# Patient Record
Sex: Female | Born: 1980 | Race: White | Hispanic: No | Smoking: Never smoker
Health system: Southern US, Community
[De-identification: ages and names within clinical notes are randomized; demographics above are authoritative.]

## PROBLEM LIST (undated history)

## (undated) DIAGNOSIS — K589 Irritable bowel syndrome without diarrhea: Secondary | ICD-10-CM

## (undated) DIAGNOSIS — R51 Headache: Secondary | ICD-10-CM

## (undated) DIAGNOSIS — F419 Anxiety disorder, unspecified: Secondary | ICD-10-CM

## (undated) DIAGNOSIS — E538 Deficiency of other specified B group vitamins: Secondary | ICD-10-CM

## (undated) DIAGNOSIS — K5900 Constipation, unspecified: Secondary | ICD-10-CM

## (undated) DIAGNOSIS — K449 Diaphragmatic hernia without obstruction or gangrene: Secondary | ICD-10-CM

## (undated) DIAGNOSIS — K3184 Gastroparesis: Secondary | ICD-10-CM

## (undated) DIAGNOSIS — N3010 Interstitial cystitis (chronic) without hematuria: Secondary | ICD-10-CM

## (undated) DIAGNOSIS — K219 Gastro-esophageal reflux disease without esophagitis: Secondary | ICD-10-CM

## (undated) HISTORY — DX: Interstitial cystitis (chronic) without hematuria: N30.10

## (undated) HISTORY — PX: UPPER GASTROINTESTINAL ENDOSCOPY: SHX188

## (undated) HISTORY — DX: Headache: R51

## (undated) HISTORY — DX: Irritable bowel syndrome, unspecified: K58.9

## (undated) HISTORY — DX: Diaphragmatic hernia without obstruction or gangrene: K44.9

## (undated) HISTORY — PX: HERNIA REPAIR: SHX51

## (undated) HISTORY — DX: Deficiency of other specified B group vitamins: E53.8

## (undated) HISTORY — PX: BOTOX INJECTION: SHX5754

## (undated) HISTORY — DX: Anxiety disorder, unspecified: F41.9

## (undated) HISTORY — DX: Gastro-esophageal reflux disease without esophagitis: K21.9

## (undated) HISTORY — DX: Constipation, unspecified: K59.00

---

## 2000-04-12 ENCOUNTER — Other Ambulatory Visit: Admission: RE | Admit: 2000-04-12 | Discharge: 2000-04-12 | Payer: Self-pay | Admitting: Obstetrics and Gynecology

## 2002-03-03 ENCOUNTER — Emergency Department (HOSPITAL_COMMUNITY): Admission: EM | Admit: 2002-03-03 | Discharge: 2002-03-03 | Payer: Self-pay | Admitting: Emergency Medicine

## 2002-08-22 ENCOUNTER — Other Ambulatory Visit: Admission: RE | Admit: 2002-08-22 | Discharge: 2002-08-22 | Payer: Self-pay | Admitting: Obstetrics and Gynecology

## 2003-08-22 ENCOUNTER — Other Ambulatory Visit: Admission: RE | Admit: 2003-08-22 | Discharge: 2003-08-22 | Payer: Self-pay | Admitting: Obstetrics and Gynecology

## 2003-12-06 ENCOUNTER — Emergency Department (HOSPITAL_COMMUNITY): Admission: EM | Admit: 2003-12-06 | Discharge: 2003-12-06 | Payer: Self-pay | Admitting: Unknown Physician Specialty

## 2004-09-12 ENCOUNTER — Other Ambulatory Visit: Admission: RE | Admit: 2004-09-12 | Discharge: 2004-09-12 | Payer: Self-pay | Admitting: Obstetrics and Gynecology

## 2005-09-14 ENCOUNTER — Other Ambulatory Visit: Admission: RE | Admit: 2005-09-14 | Discharge: 2005-09-14 | Payer: Self-pay | Admitting: Obstetrics and Gynecology

## 2006-05-25 HISTORY — PX: DIAPHRAGMATIC HERNIA REPAIR: SUR1167

## 2006-05-25 HISTORY — PX: CHOLECYSTECTOMY: SHX55

## 2006-06-04 ENCOUNTER — Inpatient Hospital Stay (HOSPITAL_COMMUNITY): Admission: AD | Admit: 2006-06-04 | Discharge: 2006-06-05 | Payer: Self-pay | Admitting: Gynecology

## 2006-06-18 ENCOUNTER — Ambulatory Visit: Payer: Self-pay | Admitting: Gastroenterology

## 2006-06-23 ENCOUNTER — Ambulatory Visit (HOSPITAL_COMMUNITY): Admission: RE | Admit: 2006-06-23 | Discharge: 2006-06-23 | Payer: Self-pay | Admitting: Gastroenterology

## 2006-09-13 ENCOUNTER — Inpatient Hospital Stay (HOSPITAL_COMMUNITY): Admission: EM | Admit: 2006-09-13 | Discharge: 2006-09-15 | Payer: Self-pay | Admitting: Emergency Medicine

## 2006-09-14 ENCOUNTER — Encounter: Payer: Self-pay | Admitting: Gastroenterology

## 2006-09-17 ENCOUNTER — Ambulatory Visit: Payer: Self-pay | Admitting: Gastroenterology

## 2006-09-24 ENCOUNTER — Encounter: Admission: RE | Admit: 2006-09-24 | Discharge: 2006-09-24 | Payer: Self-pay | Admitting: Obstetrics and Gynecology

## 2006-09-24 ENCOUNTER — Observation Stay (HOSPITAL_COMMUNITY): Admission: EM | Admit: 2006-09-24 | Discharge: 2006-09-25 | Payer: Self-pay | Admitting: Emergency Medicine

## 2007-06-08 IMAGING — NM NM HEPATO W/GB/PHARM/[PERSON_NAME]
2 series · 12 of 12 positions shown · non-contrast
Comparison: Ultrasound 06/05/06.

CLINICAL DATA: 25 year-old with chronic gallbladder disease.
NUCLEAR MEDICINE HEPATOBILIARY SCAN WITH EJECTION FRACTION:
TECHNIQUE: Sequential abdominal images were obtained following intravenous injection of radiopharmaceutical.  Sequential images were continued following oral ingestion of 8 oz. half-and-half, and the gallbladder ejection fraction was calculated.
Radiopharmaceutical:   mCi Nc-99m Choletec

[Series 1: he hepatobiliary · 3.21mm/px · 6 of 60 frames shown (1 of 2)]
[frame 6/60]
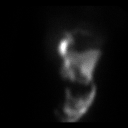
[frame 16/60]
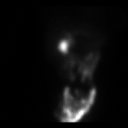
[frame 26/60]
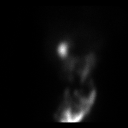
[frame 36/60]
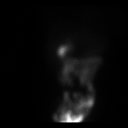
[frame 46/60]
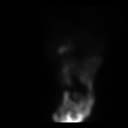
[frame 56/60]
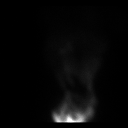

[Series 1: he hepatobiliary · 3.21mm/px · 6 of 60 frames shown (2 of 2)]
[frame 6/60]
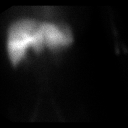
[frame 16/60]
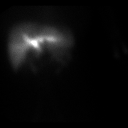
[frame 26/60]
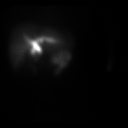
[frame 36/60]
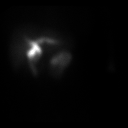
[frame 46/60]
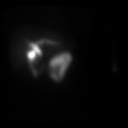
[frame 56/60]
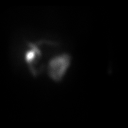

[12 of 12 positions shown; findings below may reference images not displayed]

FINDINGS: There is prompt symmetric uptake in the liver and prompt excretion in the biliary tree, which is visualized at approximately 9 minutes. Activity is seen in the small bowel by 18 minutes. Activity is seen in the gallbladder beginning at approximately 24 minutes.
The patient?s gallbladder ejection fraction was estimated at 93%.
IMPRESSION: Normal biliary patency study and normal gallbladder ejection fraction.

## 2009-11-12 ENCOUNTER — Telehealth: Payer: Self-pay | Admitting: Gastroenterology

## 2009-11-13 ENCOUNTER — Telehealth: Payer: Self-pay | Admitting: Gastroenterology

## 2009-11-22 ENCOUNTER — Ambulatory Visit: Payer: Self-pay | Admitting: Gastroenterology

## 2009-11-22 DIAGNOSIS — K219 Gastro-esophageal reflux disease without esophagitis: Secondary | ICD-10-CM | POA: Insufficient documentation

## 2009-11-22 DIAGNOSIS — R112 Nausea with vomiting, unspecified: Secondary | ICD-10-CM

## 2009-11-27 ENCOUNTER — Ambulatory Visit: Payer: Self-pay | Admitting: Gastroenterology

## 2009-11-27 DIAGNOSIS — E538 Deficiency of other specified B group vitamins: Secondary | ICD-10-CM | POA: Insufficient documentation

## 2009-11-27 LAB — CONVERTED CEMR LAB
Albumin: 4.2 g/dL (ref 3.5–5.2)
BUN: 18 mg/dL (ref 6–23)
Basophils Absolute: 0 10*3/uL (ref 0.0–0.1)
Creatinine, Ser: 0.9 mg/dL (ref 0.4–1.2)
Eosinophils Absolute: 0.2 10*3/uL (ref 0.0–0.7)
Eosinophils Relative: 2.1 % (ref 0.0–5.0)
Ferritin: 18.5 ng/mL (ref 10.0–291.0)
Folate: 8.4 ng/mL
GFR calc non Af Amer: 75.79 mL/min (ref >60)
HCT: 37.4 % (ref 36.0–46.0)
Iron: 76 ug/dL (ref 42–145)
Lymphocytes Relative: 30.6 % (ref 12.0–46.0)
Monocytes Absolute: 0.4 10*3/uL (ref 0.1–1.0)
Monocytes Relative: 5.3 % (ref 3.0–12.0)
Neutro Abs: 4.9 10*3/uL (ref 1.4–7.7)
Platelets: 150 10*3/uL (ref 150.0–400.0)
TSH: 2.34 microintl units/mL (ref 0.35–5.50)
Total Bilirubin: 0.4 mg/dL (ref 0.3–1.2)
Vitamin B-12: 151 pg/mL — ABNORMAL LOW (ref 211–911)
WBC: 7.9 10*3/uL (ref 4.5–10.5)

## 2009-12-02 ENCOUNTER — Telehealth: Payer: Self-pay | Admitting: Gastroenterology

## 2009-12-04 ENCOUNTER — Ambulatory Visit: Payer: Self-pay | Admitting: Gastroenterology

## 2009-12-05 ENCOUNTER — Encounter: Payer: Self-pay | Admitting: Gastroenterology

## 2009-12-09 ENCOUNTER — Encounter: Payer: Self-pay | Admitting: Gastroenterology

## 2009-12-10 ENCOUNTER — Ambulatory Visit: Payer: Self-pay | Admitting: Gastroenterology

## 2009-12-10 DIAGNOSIS — R51 Headache: Secondary | ICD-10-CM

## 2009-12-10 DIAGNOSIS — R519 Headache, unspecified: Secondary | ICD-10-CM | POA: Insufficient documentation

## 2009-12-10 DIAGNOSIS — K5900 Constipation, unspecified: Secondary | ICD-10-CM | POA: Insufficient documentation

## 2009-12-26 ENCOUNTER — Telehealth: Payer: Self-pay | Admitting: Gastroenterology

## 2010-02-04 ENCOUNTER — Telehealth: Payer: Self-pay | Admitting: Gastroenterology

## 2010-02-07 ENCOUNTER — Ambulatory Visit: Payer: Self-pay | Admitting: Gastroenterology

## 2010-03-07 ENCOUNTER — Ambulatory Visit: Payer: Self-pay | Admitting: Gastroenterology

## 2010-03-17 ENCOUNTER — Encounter: Payer: Self-pay | Admitting: Gastroenterology

## 2010-03-21 ENCOUNTER — Encounter: Payer: Self-pay | Admitting: Gastroenterology

## 2010-03-25 ENCOUNTER — Ambulatory Visit (HOSPITAL_COMMUNITY)
Admission: RE | Admit: 2010-03-25 | Discharge: 2010-03-25 | Payer: Self-pay | Source: Home / Self Care | Admitting: Obstetrics and Gynecology

## 2010-03-25 ENCOUNTER — Ambulatory Visit: Payer: Self-pay | Admitting: Gastroenterology

## 2010-03-25 DIAGNOSIS — K589 Irritable bowel syndrome without diarrhea: Secondary | ICD-10-CM | POA: Insufficient documentation

## 2010-03-25 LAB — CONVERTED CEMR LAB
Basophils Absolute: 0 10*3/uL (ref 0.0–0.1)
Bilirubin, Direct: 0.1 mg/dL (ref 0.0–0.3)
Calcium: 8.6 mg/dL (ref 8.4–10.5)
Chloride: 107 meq/L (ref 96–112)
Creatinine, Ser: 0.8 mg/dL (ref 0.4–1.2)
Eosinophils Absolute: 0.1 10*3/uL (ref 0.0–0.7)
Ferritin: 14.9 ng/mL (ref 10.0–291.0)
Hemoglobin, Urine: NEGATIVE
Hemoglobin: 12.4 g/dL (ref 12.0–15.0)
Iron: 145 ug/dL (ref 42–145)
Lymphocytes Relative: 30.1 % (ref 12.0–46.0)
Lymphs Abs: 2.1 10*3/uL (ref 0.7–4.0)
MCHC: 34.2 g/dL (ref 30.0–36.0)
Neutro Abs: 4.4 10*3/uL (ref 1.4–7.7)
Nitrite: NEGATIVE
RDW: 13.3 % (ref 11.5–14.6)
Specific Gravity, Urine: 1.02 (ref 1.000–1.030)
Total Bilirubin: 0.6 mg/dL (ref 0.3–1.2)
Total Protein, Urine: NEGATIVE mg/dL
Transferrin: 329.4 mg/dL (ref 212.0–360.0)
Vitamin B-12: >1500 pg/mL — ABNORMAL HIGH (ref 211–911)
pH: 7 (ref 5.0–8.0)

## 2010-03-26 ENCOUNTER — Encounter: Payer: Self-pay | Admitting: Gastroenterology

## 2010-04-01 ENCOUNTER — Telehealth: Payer: Self-pay | Admitting: Gastroenterology

## 2010-04-25 ENCOUNTER — Ambulatory Visit: Payer: Self-pay | Admitting: Gastroenterology

## 2010-05-30 ENCOUNTER — Ambulatory Visit
Admission: RE | Admit: 2010-05-30 | Discharge: 2010-05-30 | Payer: Self-pay | Source: Home / Self Care | Attending: Gastroenterology | Admitting: Gastroenterology

## 2010-06-14 ENCOUNTER — Encounter: Payer: Self-pay | Admitting: Gastroenterology

## 2010-06-15 ENCOUNTER — Encounter: Payer: Self-pay | Admitting: Neurology

## 2010-06-24 NOTE — Progress Notes (Signed)
Summary: Meds not covered by her insurance  Phone Note Call from Patient Call back at Home Phone [phone]   Call For: Dr Jarold Motto Reason for Call: Talk to Nurse Summary of Call: Nexium or Zegrid is not approved by her insurance. Would like for you to call her insurance for alternative meds. [employer] [id number] Initial call taken by: Leanor Kail [facility],  December 26, 2009 11:28 AM  Follow-up for Phone Call        Talked with pt.  Pt looked on internet site and found out that omeprazole is covered.  Will change rx and pt will report back if does not work for her.  Follow-up by: Ashok Cordia RN,  December 26, 2009 11:46 AM    New/Updated Medications: OMEPRAZOLE 40 MG  CPDR (OMEPRAZOLE) 1 each day 30 minutes before meal Prescriptions: OMEPRAZOLE 40 MG  CPDR (OMEPRAZOLE) 1 each day 30 minutes before meal  #30 x 11   Entered by:   Ashok Cordia RN   Authorized by:   Mardella Layman MD [facility]   Signed by:   Ashok Cordia RN on 12/26/2009   Method used:   Electronically to        [facility]. [phone]* (retail)       [address]       [place], [place]  [address]       Ph: [phone]       Fax: [phone]   RxID:   [id number]

## 2010-06-24 NOTE — Assessment & Plan Note (Signed)
Summary: #1 of 3 weekly B12 inj/dfs  Nurse Visit   Allergies: 1)  ! * Aciphex  Medication Administration  Injection # 1:    Medication: Vit B12 1000 mcg    Diagnosis: VITAMIN B12 DEFICIENCY (ICD-266.2)    Route: IM    Site: L deltoid    Exp Date: 02/13    Lot #: [id number]    Mfr: American Regent    Patient tolerated injection without complications    Given by: Ashok Cordia RN (November 27, 2009 12:48 PM)  Orders Added: 1)  Vit B12 1000 mcg [J3420]

## 2010-06-24 NOTE — Progress Notes (Signed)
Summary: B12 injection  Phone Note Call from Patient Call back at Home Phone [phone]   Caller: Patient Call For: Dr. Jarold Motto Reason for Call: Talk to Nurse Summary of Call: having difficulty with  B12 injections rx's Initial call taken by: Vallarie Mare,  February 04, 2010 1:26 PM  Follow-up for Phone Call        patient will start monthly b12 injections since nascobal is too expensive Follow-up by: Harlow Mares CMA Duncan Dull),  February 04, 2010 2:28 PM

## 2010-06-24 NOTE — Assessment & Plan Note (Signed)
Summary: MONTHLY B12...AS.  Nurse Visit   Allergies: 1)  ! * Aciphex 2)  ! * Celphosporins  Medication Administration  Injection # 1:    Medication: Vit B12 1000 mcg    Diagnosis: VITAMIN B12 DEFICIENCY (ICD-266.2)    Route: IM    Site: L deltoid    Exp Date: 10/13    Lot #: [id number]    Mfr: American Regent    Patient tolerated injection without complications    Given by: Lamona Curl CMA (AAMA) (April 25, 2010 1:17 PM)  Orders Added: 1)  Vit B12 1000 mcg [J3420]   Medication Administration  Injection # 1:    Medication: Vit B12 1000 mcg    Diagnosis: VITAMIN B12 DEFICIENCY (ICD-266.2)    Route: IM    Site: L deltoid    Exp Date: 10/13    Lot #: [id number]    Mfr: American Regent    Patient tolerated injection without complications    Given by: Lamona Curl CMA (AAMA) (April 25, 2010 1:17 PM)  Orders Added: 1)  Vit B12 1000 mcg [J3420]

## 2010-06-24 NOTE — Assessment & Plan Note (Signed)
Summary: f/u reflux/ and 3 of 3 b12all   History of Present Illness Visit Type: Follow-up Visit Primary GI MD: Sheryn Bison MD FACP FAGA Primary Provider: Herb Grays, MD  Requesting Provider: na Chief Complaint: Chronic headaches and nausea. Pt wants to discuss switching PPI's and get 3rd B12 injection.  History of Present Illness:   Continued headaches despite neurology consultation. She has not used any of her GI medications as recommended and continues with reflux symptoms.   GI Review of Systems      Denies abdominal pain, acid reflux, belching, bloating, chest pain, dysphagia with liquids, dysphagia with solids, heartburn, loss of appetite, nausea, vomiting, vomiting blood, weight loss, and  weight gain.        Denies anal fissure, black tarry stools, change in bowel habit, constipation, diarrhea, diverticulosis, fecal incontinence, heme positive stool, hemorrhoids, irritable bowel syndrome, jaundice, light color stool, liver problems, rectal bleeding, and  rectal pain.    Current Medications (verified): 1)  Reglan 10 Mg Tabs (Metoclopramide Hcl) .... One Tablet By Mouth Every 6-8 Hours As Needed For Nausea and Vomiting 2)  Cyanocobalamin 1000 Mcg/ml Inj Soln (Cyanocobalamin) .Marland Kitchen.. 1 Cc Im Weekly X 3. 3)  Nascobal 500 Mcg/0.51ml Soln (Cyanocobalamin) .... 2 Spray Once A Week 4)  Lamictal 150 Mg Tabs (Lamotrigine) .... One Tablet By Mouth Once Daily 5)  Trinessa (28) 0.18/0.215/0.25 Mg-35 Mcg Tabs (Norgestim-Eth Estrad Triphasic) .... One Tablet By Mouth Once Daily  Allergies: 1)  ! * Aciphex  Past History:  Past medical, surgical, family and social histories (including risk factors) reviewed for relevance to current acute and chronic problems.  Past Medical History: Reviewed history from 11/22/2009 and no changes required. Anxiety Disorder GERD Diaphragmatic hernia Chronic Headaches  Past Surgical History: Reviewed history from 11/18/2009 and no changes  required. Cholecystectomy  Family History: Reviewed history from 11/22/2009 and no changes required. No FH of Colon Cancer:  Social History: Reviewed history from 11/22/2009 and no changes required. Engaged--Married in one month  No Childern [employer]  Patient has never smoked.  Alcohol Use - no Daily Caffeine Use: one cup of coffee or one cup of soda  Illicit Drug Use - no  Review of Systems       The patient complains of headaches.  The patient denies anorexia, fever, weight loss, weight gain, vision loss, decreased hearing, hoarseness, chest pain, syncope, dyspnea on exertion, peripheral edema, prolonged cough, hemoptysis, abdominal pain, melena, hematochezia, severe indigestion/heartburn, hematuria, incontinence, genital sores, muscle weakness, suspicious skin lesions, transient blindness, difficulty walking, depression, unusual weight change, abnormal bleeding, enlarged lymph nodes, angioedema, breast masses, and testicular masses.    Vital Signs:  Patient profile:   30 year old female Height:      64 inches Weight:      161.13 pounds BMI:     27.76 Pulse rate:   76 / minute Pulse rhythm:   regular BP sitting:   104 / 60  (right arm) Cuff size:   regular  Vitals Entered By: Christie Nottingham CMA Duncan Dull) (December 10, 2009 4:06 PM)  Physical Exam  General:  Well developed, well nourished, no acute distress.healthy appearing.   Head:  Normocephalic and atraumatic. Eyes:  PERRLA, no icterus.exam deferred to patient's ophthalmologist.   Neurologic:  Alert and  oriented x4;  grossly normal neurologically. Psych:  Alert and cooperative. Normal mood and affect.   Impression & Recommendations:  Problem # 1:  GERD (ICD-530.81) Assessment Unchanged Previous reaction AcipHex, and the patient cannot  get Zegerid from her insurance company. We will try Nexium 40 mg q.a.m. with Reglan 10 mg at bedtime. Standard anti-reflex maneuvers also reviewed.  Problem # 2:  VITAMIN B12  DEFICIENCY (ICD-266.2) Assessment: Improved nasal B12 spray as recommended.  Problem # 3:  HEADACHE (ICD-784.0) Assessment: Unchanged Continued followup and therapy per neurology recommended stressed to this patient. She has chronic migraine headaches which are apparently difficult to manage and control. Some of her nausea and vomiting are obviously related to her neurologic difficulties. Remains unclear to me whether or not she had imaging such as CT scan or MRI exams.This again has been recommended.  Problem # 4:  CONSTIPATION (ICD-564.00) Assessment: Unchanged p.r.n. MiraLax at bedtime as tolerated.  Patient Instructions: 1)  Take nexium each morning and reglan at bedtime. 2)  Begin Miralax at bedtime. 3)  The medication list was reviewed and reconciled.  All changed / newly prescribed medications were explained.  A complete medication list was provided to the patient / caregiver. 4)  Avoid foods high in acid content ( tomatoes, citrus juices, spicy foods) . Avoid eating within 3 to 4 hours of lying down or before exercising. Do not over eat; try smaller more frequent meals. Elevate head of bed four inches when sleeping.  5)  Please call our GI Office back in 2 weeks with a follow-up of symptoms. Prescriptions: NASCOBAL 500 MCG/0.1ML SOLN (CYANOCOBALAMIN) 1 spray once a week  #1 x 6   Entered by:   Ashok Cordia RN   Authorized by:   Mardella Layman MD [facility]   Signed by:   Ashok Cordia RN on 12/10/2009   Method used:   Print then Give to Patient   RxID:   [id number]   Appended Document: f/u reflux/ and 3 of 3 b12all    Clinical Lists Changes  Orders: Added new Service order of Vit B12 1000 mcg (F6433) - Signed       Medication Administration  Injection # 1:    Medication: Vit B12 1000 mcg    Diagnosis: VITAMIN B12 DEFICIENCY (ICD-266.2)    Route: IM    Site: L deltoid    Exp Date: 02/13    Lot #: [id number]    Mfr: American Regent    Patient tolerated injection  without complications    Given by: Ashok Cordia RN (December 10, 2009 4:43 PM)  Orders Added: 1)  Vit B12 1000 mcg [J3420]

## 2010-06-24 NOTE — Progress Notes (Signed)
Summary: Triage  Phone Note Call from Patient Call back at Home Phone [phone]   Caller: Patient Call For: Dr. Jarold Motto Reason for Call: Talk to Nurse Summary of Call: pt. had an hernia and had it removed in 2008. She is having acid reflux, vomiting and burning in esophagus Initial call taken by: Karna Christmas,  November 12, 2009 9:36 AM  Follow-up for Phone Call        bid aciphexPt had GB removed in 2008 and then had surgery for a diaphragmatic hernia.  Pt now complains of reflux and abd pain.  Appt scheduled for November 22, 2009.  Pt asking if we can suggest anything for her to try until OV. Follow-up by: Ashok Cordia RN,  November 12, 2009 10:20 AM  Additional Follow-up for Phone Call Additional follow up Details #1::        two times a day aciphex Additional Follow-up by: Mardella Layman MD Clementeen Graham,  November 12, 2009 12:23 PM     Appended Document: Triage Lm fot pt to call.   Appended Document: Triage Pt notified.   Clinical Lists Changes  Medications: Added new medication of ACIPHEX 20 MG  TBEC (RABEPRAZOLE SODIUM) Take 1 twice a day 30 minutes before meals - Signed Rx of ACIPHEX 20 MG  TBEC (RABEPRAZOLE SODIUM) Take 1 twice a day 30 minutes before meals;  #60 x 6;  Signed;  Entered by: Ashok Cordia RN;  Authorized by: Mardella Layman MD [facility];  Method used: Electronically to [facility]. [phone]*, [address], [place], [place]  [address], Ph: [phone], Fax: [phone]    Prescriptions: ACIPHEX 20 MG  TBEC (RABEPRAZOLE SODIUM) Take 1 twice a day 30 minutes before meals  #60 x 6   Entered by:   Ashok Cordia RN   Authorized by:   Mardella Layman MD [facility]   Signed by:   Ashok Cordia RN on 11/12/2009   Method used:   Electronically to        [facility]. [phone]* (retail)       [address] [place], [place]  [address]       Ph: [phone]       Fax: [phone]   RxID:   [id number]

## 2010-06-24 NOTE — Assessment & Plan Note (Signed)
Summary: MONTHLY B12 SHOT...LSW.  Nurse Visit   Allergies: 1)  ! * Aciphex  Medication Administration  Injection # 1:    Medication: Vit B12 1000 mcg    Diagnosis: VITAMIN B12 DEFICIENCY (ICD-266.2)    Route: IM    Site: L deltoid    Exp Date: 11/23/2011    Lot #: [id number]    Mfr: American Regent    Patient tolerated injection without complications    Given by: Christie Nottingham CMA Duncan Dull) (March 07, 2010 10:27 AM)  Orders Added: 1)  Vit B12 1000 mcg [J3420]

## 2010-06-24 NOTE — Assessment & Plan Note (Signed)
Summary: FOLLOW UP/YF   History of Present Illness Visit Type: Initial Visit Primary GI MD: Sheryn Bison MD FACP FAGA Primary Chang Tiggs: Herb Grays, MD  Requesting Gurtha Picker: na Chief Complaint: Abdominal pain, was treated for UTI at Urgent care but is still having pain, Had xray that showed her colon full of stool. Pt is suppose to have a pelvic exam today History of Present Illness:   30 year old Caucasian female that we have been following for acid reflux taking omeprazole 40 mg q.a.m. and Reglan 10 mg at bedtime. She recently has had dysuria and urinary frequency with 2 apparently negative urinalysis exams. She did take a 5 day course of Macrobid. She continues with urinary frequency and dysuria without specific GI complaints except for gas, bloating, and occasional right CVA pain.  As part of her recent workup for her dysuria she had a KUB which apparently showed excessive amounts of stool in her colon. Because of this she has been on p.r.n. MiraLax and Colace. She never had any clinical constipation problems or symptoms before she was told that she was constipated. She denies rectal bleeding, lower abdominal pain, or other neurological problems. 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 Yehuda Budd. Family history is noncontributory.   GI Review of Systems    Reports abdominal pain, bloating, and  vomiting.     Location of  Abdominal pain: lower abdomen.    Denies acid reflux, belching, chest pain, dysphagia with liquids, dysphagia with solids, heartburn, loss of appetite, nausea, vomiting blood, weight loss, and  weight gain.      Reports constipation.     Denies anal fissure, black tarry stools, change in bowel habit, diarrhea, diverticulosis, fecal incontinence, heme positive stool, hemorrhoids, irritable bowel syndrome, jaundice, light color stool, liver problems, rectal bleeding, and  rectal pain.    Current  Medications (verified): 1)  Reglan 10 Mg Tabs (Metoclopramide Hcl) .Marland Kitchen.. 1 By Mouth Q Hs 2)  Lamictal 150 Mg Tabs (Lamotrigine) .... One Tablet By Mouth Once Daily 3)  Trinessa (28) 0.18/0.215/0.25 Mg-35 Mcg Tabs (Norgestim-Eth Estrad Triphasic) .... One Tablet By Mouth Once Daily 4)  Omeprazole 40 Mg  Cpdr (Omeprazole) .Marland Kitchen.. 1 Each Day 30 Minutes Before Meal 5)  Miralax  Powd (Polyethylene Glycol 3350) .... Use As Directed At Bedtime 6)  Adderall Xr 30 Mg Xr24h-Cap (Amphetamine-Dextroamphetamine) .Marland Kitchen.. 1 By Mouth Once Daily 7)  Colace 100 Mg Caps (Docusate Sodium) .Marland Kitchen.. 1 By Mouth Two Times A Day 8)  Zoloft 100 Mg Tabs (Sertraline Hcl) .Marland Kitchen.. 1 By Mouth Once Daily  Allergies: 1)  ! * Aciphex 2)  ! * Celphosporins  Past History:  Family History: Last updated: 11/22/2009 No FH of Colon Cancer:  Past medical, surgical, family and social histories (including risk factors) reviewed for relevance to current acute and chronic problems.  Past Medical History: Reviewed history from 11/22/2009 and no changes required. Anxiety Disorder GERD Diaphragmatic hernia Chronic Headaches  Past Surgical History: Reviewed history from 11/18/2009 and no changes required. Cholecystectomy  Family History: Reviewed history from 11/22/2009 and no changes required. No FH of Colon Cancer:  Social History: Reviewed history from 11/22/2009 and no changes required. 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  Alcohol Use - no Daily Caffeine Use: one cup of coffee or one cup of soda  Illicit Drug Use - no  Review of Systems  The patient complains of back pain and urination - excessive.  The patient denies allergy/sinus, anemia, anxiety-new, arthritis/joint pain, blood in urine, breast changes/lumps, change in vision, confusion, cough, coughing up blood, depression-new, fainting, fatigue, fever, headaches-new, hearing problems, heart murmur, heart rhythm changes, itching, menstrual pain,  muscle pains/cramps, night sweats, nosebleeds, pregnancy symptoms, shortness of breath, skin rash, sleeping problems, sore throat, swelling of feet/legs, swollen lymph glands, thirst - excessive , urination - excessive , urination changes/pain, urine leakage, vision changes, and voice change.    Vital Signs:  Patient profile:   30 year old female Height:      64 inches Weight:      166 pounds BMI:     28.60 BSA:     1.81 Pulse rate:   80 / minute Pulse rhythm:   regular BP sitting:   104 / 80  (left arm)  Vitals Entered By: Merri Ray CMA Duncan Dull) (March 25, 2010 10:38 AM)  Physical Exam  General:  Well developed, well nourished, no acute distress.healthy appearing.   Head:  Normocephalic and atraumatic. Eyes:  PERRLA, no icterus.exam deferred to patient's ophthalmologist.   Lungs:  Clear throughout to auscultation. Heart:  Regular rate and rhythm; no murmurs, rubs,  or bruits. Abdomen:  Soft, nontender and nondistended. No masses, hepatosplenomegaly or hernias noted. Normal bowel sounds. Rectal:  Normal exam.no rectal masses, tenderness, or impaction. Soft stool is present which is guaiac negative. Msk:  Symmetrical with no gross deformities. Normal posture. Pulses:  Normal pulses noted. Extremities:  No clubbing, cyanosis, edema or deformities noted. Neurologic:  Alert and  oriented x4;  grossly normal neurologically. Psych:  Alert and cooperative. Normal mood and affect.agitated.     Impression & Recommendations:  Problem # 1:  IRRITABLE BOWEL SYNDROME (ICD-564.1) Assessment Unchanged constipation predominant IBS for satiety gas, bloating, crampy abdominal pain. We will prescribe Benefiber q.a.m. and MiraLax at bedtime. I do not think she needs colonoscopy exam at this time. Screening labs are been ordered along with repeat urinalysis and culture. She has appointment for GYN exam later today. Orders: TLB-Udip w/ Micro (81001-URINE) T-Culture, Urine  ([id number]) TLB-CBC Platelet - w/Differential (85025-CBCD) TLB-BMP (Basic Metabolic Panel-BMET) (80048-METABOL) TLB-Hepatic/Liver Function Pnl (80076-HEPATIC) TLB-TSH (Thyroid Stimulating Hormone) (84443-TSH) TLB-B12, Serum-Total ONLY (09811-B14) TLB-Ferritin (82728-FER) TLB-Folic Acid (Folate) (82746-FOL) TLB-IBC Pnl (Iron/FE;Transferrin) (83550-IBC) TLB-Sedimentation Rate (ESR) (85652-ESR) T-igA ([address]) T-Sprue Panel (Celiac Disease [facility] Eval) (83516x3/[address]) Ultrasound Abdomen (UAS)  Problem # 2:  VITAMIN B12 DEFICIENCY (ICD-266.2) Assessment: Improved she is on parenteral B12 replacement therapy. Orders: Vit B12 1000 mcg (J3420)  Problem # 3:  GERD (ICD-530.81) Assessment: Improved Continue reflex regime and previous therapy. Consider 24-hour pH probe testing and manometry. Upper abdominal ultrasound exam to exclude cholelithiasis has been ordered.  Patient Instructions: 1)  Copy sent to : Herb Grays, MD  & Luberta Mutter, RNC 2)  Please continue current medications.  3)  Please go to the basement today for your labs.  4)  Your ultrasound is scheduled for 04/04/2010, please follow the seperate instructions. 5)  The medication list was reviewed and reconciled.  All changed / newly prescribed medications were explained.  A complete medication list was provided to the patient / caregiver. 6)  Please schedule a follow-up appointment in 1 month.  7)  Diet should be high in fiber ( fruits, vegetables, whole grains) but low in residue. Drink at least eight (8) glasses of water a day.    Medication Administration  Injection # 1:  Medication: Vit B12 1000 mcg    Diagnosis: VITAMIN B12 DEFICIENCY (ICD-266.2)    Route: IM    Site: L deltoid    Exp Date: 11/2011    Lot #: [id number]    Mfr: American Regent    Patient tolerated injection without complications    Given by: Harlow Mares CMA (AAMA) (March 25, 2010 11:20 AM)  Orders Added: 1)  TLB-Udip w/ Micro [81001-URINE] 2)   T-Culture, Urine [[id number]] 3)  TLB-CBC Platelet - w/Differential [85025-CBCD] 4)  TLB-BMP (Basic Metabolic Panel-BMET) [80048-METABOL] 5)  TLB-Hepatic/Liver Function Pnl [80076-HEPATIC] 6)  TLB-TSH (Thyroid Stimulating Hormone) [84443-TSH] 7)  TLB-B12, Serum-Total ONLY [82607-B12] 8)  TLB-Ferritin [82728-FER] 9)  TLB-Folic Acid (Folate) [82746-FOL] 10)  TLB-IBC Pnl (Iron/FE;Transferrin) [83550-IBC] 11)  TLB-Sedimentation Rate (ESR) [85652-ESR] 12)  T-igA [23800] 13)  T-Sprue Panel (Celiac Disease Aby Eval) [83516x3/[address]] 14)  Ultrasound Abdomen [UAS] 15)  Vit B12 1000 mcg [J3420]

## 2010-06-24 NOTE — Miscellaneous (Signed)
  Clinical Lists Changes  Medications: Changed medication from ZEGERID 40-1100 MG CAPS (OMEPRAZOLE-SODIUM BICARBONATE) one capsule by mouth once daily to NEXIUM 40 MG CPDR (ESOMEPRAZOLE MAGNESIUM) one tablet by mouth once daily - Signed Rx of NEXIUM 40 MG CPDR (ESOMEPRAZOLE MAGNESIUM) one tablet by mouth once daily;  #30 x 5;  Signed;  Entered by: Ok Anis CMA;  Authorized by: Mardella Layman MD [facility];  Method used: Electronically to [facility] [phone]*, [address], [place], [place]  [address], Ph: [phone], Fax: [phone]  Per [employer] Zegerid is not covered but Nexium is  Prescriptions: NEXIUM 40 MG CPDR (ESOMEPRAZOLE MAGNESIUM) one tablet by mouth once daily  #30 x 5   Entered by:   Ok Anis CMA   Authorized by:   Mardella Layman MD [facility]   Signed by:   Ok Anis CMA on 12/09/2009   Method used:   Electronically to        [facility]. [phone]* (retail)       [address]       [place], [place]  [address]       Ph: [phone]       Fax: [phone]   RxID:   [id number]

## 2010-06-24 NOTE — Progress Notes (Signed)
Summary: Aciphex Prior Auth  Phone Note From Pharmacy   Caller: [facility]. [phone]* Summary of Call: Received fax, Aciphex requires prior authorization.  Pt notified.  Samples of aciphex given to pt until prior auth can be done.   Initial call taken by: Ashok Cordia RN,  November 13, 2009 8:58 AM    New/Updated Medications: ACIPHEX 20 MG  TBEC (RABEPRAZOLE SODIUM) Take 1 twice a day 30 minutes before meals

## 2010-06-24 NOTE — Progress Notes (Signed)
Summary: MRI request  Phone Note Call from Patient Call back at Home Phone [phone]   Caller: Patient Call For: Dr. Jarold Motto Reason for Call: Talk to Nurse Summary of Call: would like Dr. Jarold Motto to order an MRI due to headaches Initial call taken by: Vallarie Mare,  December 02, 2009 3:05 PM  Follow-up for Phone Call        Pt has been having headaches for 4 weeks now.  Pt has seen a neurologist and has been given nine different rx to try.  Nothing has helped.  Pt feels she needs some type of xray of her head.  Neurologist has never suggesteded any type of testing.  Pt instructed to call PCP and discuss situtation. Follow-up by: Ashok Cordia RN,  December 02, 2009 3:14 PM

## 2010-06-24 NOTE — Letter (Signed)
Summary: [facility]  [facility]   Imported By: Lester Brandywine 03/28/2010 07:25:02  _____________________________________________________________________  External Attachment:    Type:   Image     Comment:   External Document

## 2010-06-24 NOTE — Assessment & Plan Note (Signed)
Summary: #2 of 3 weekly B12/dfs  Nurse Visit   Allergies: 1)  ! * Aciphex  Medication Administration  Injection # 1:    Medication: Vit B12 1000 mcg    Diagnosis: VITAMIN B12 DEFICIENCY (ICD-266.2)    Route: IM    Site: L deltoid    Exp Date: 06/26/2011    Lot #: [id number]    Mfr: American Regent    Patient tolerated injection without complications    Given by: Harlow Mares CMA Duncan Dull) (December 04, 2009 10:44 AM)

## 2010-06-24 NOTE — Assessment & Plan Note (Signed)
Summary: Reflux/dfs   History of Present Illness Visit Type: new patient  Primary GI MD: Sheryn Bison MD FACP FAGA Primary Provider: Herb Grays, MD  Requesting Provider: na Chief Complaint: GERD, nausea, and vomiting. Pt had allergic reaction to Aciphex  History of Present Illness:   30 year old Caucasian female status post cholecystectomy in 2008 followed by surgical repair of a diaphragmatic hernia in April 2008.  She has done well from a GI standpoint until the last several months that she's had recurrent dyspepsia, reflux symptoms, and intermittent nausea and vomiting perhaps related to rather severe migraine headaches. She currently is on periodic prednisone, Percocet, and recently has been placed on Lodine 500 mg twice a day by neurology. Because of her reflux symptoms she was placed on AcipHex 20 mg a day and had acute shortness of breath /stridor.She was seen in urgent care and treated with antihistamines and Zantac with good response. Extensive review of her chart shows previous excellent tolerance of Prilosec and Zegerid. She currently denies allergic type symptomatology, cardiopulmonary or systemic complaints. In the past, she is due to Reglan with good symptomatic improvement. She's had no anorexia, weight loss, lower GI were specific hepatobiliary complaints currently.   GI Review of Systems    Reports acid reflux, heartburn, nausea, and  vomiting.      Denies abdominal pain, belching, bloating, chest pain, dysphagia with liquids, dysphagia with solids, loss of appetite, vomiting blood, weight loss, and  weight gain.        Denies anal fissure, black tarry stools, change in bowel habit, constipation, diarrhea, diverticulosis, fecal incontinence, heme positive stool, hemorrhoids, irritable bowel syndrome, jaundice, light color stool, liver problems, rectal bleeding, and  rectal pain.    Current Medications (verified): 1)  Percocet 5-325 Mg Tabs (Oxycodone-Acetaminophen) ....  One To Two Tablets By Mouth Every Four Hours As Needed For Pain 2)  Etodolac 500 Mg Tabs (Etodolac) .... One Tablet By Mouth Two Times A Day As Needed For Pain  Allergies (verified): 1)  ! * Aciphex  Past History:  Past medical, surgical, family and social histories (including risk factors) reviewed for relevance to current acute and chronic problems.  Past Medical History: Anxiety Disorder GERD Diaphragmatic hernia Chronic Headaches  Past Surgical History: Reviewed history from 11/18/2009 and no changes required. Cholecystectomy  Family History: Reviewed history and no changes required. No FH of Colon Cancer:  Social History: Reviewed history and no changes required. Engaged--Married in one month  No Childern [employer]  Patient has never smoked.  Alcohol Use - no Daily Caffeine Use: one cup of coffee or one cup of soda  Illicit Drug Use - no Smoking Status:  never Drug Use:  no  Review of Systems       The patient complains of headaches-new.  The patient denies allergy/sinus, anemia, anxiety-new, arthritis/joint pain, back pain, blood in urine, breast changes/lumps, change in vision, confusion, cough, coughing up blood, depression-new, fainting, fatigue, fever, hearing problems, heart murmur, heart rhythm changes, itching, menstrual pain, muscle pains/cramps, night sweats, nosebleeds, pregnancy symptoms, shortness of breath, skin rash, sleeping problems, sore throat, swelling of feet/legs, swollen lymph glands, thirst - excessive , urination - excessive , urination changes/pain, urine leakage, vision changes, and voice change.         normal menstrual periods she denies any chance of pregnancy.  Vital Signs:  Patient profile:   30 year old female Height:      64 inches Weight:      160 pounds  BMI:     27.56 BSA:     1.78 Pulse rate:   60 / minute Pulse rhythm:   regular BP sitting:   110 / 64  (left arm) Cuff size:   regular  Vitals Entered By: Ok Anis CMA  (November 22, 2009 11:10 AM)  Physical Exam  General:  Well developed, well nourished, no acute distress.healthy appearing.   Head:  Normocephalic and atraumatic. Eyes:  PERRLA, no icterus.exam deferred to patient's ophthalmologist.   Lungs:  Clear throughout to auscultation. Heart:  Regular rate and rhythm; no murmurs, rubs,  or bruits. Abdomen:  Soft, nontender and nondistended. No masses, hepatosplenomegaly or hernias noted. Normal bowel sounds. Msk:  Symmetrical with no gross deformities. Normal posture. Pulses:  Normal pulses noted. Extremities:  No clubbing, cyanosis, edema or deformities noted. Neurologic:  Alert and  oriented x4;  grossly normal neurologically. Cervical Nodes:  No significant cervical adenopathy. Psych:  Alert and cooperative. Normal mood and affect.   Impression & Recommendations:  Problem # 1:  GERD (ICD-530.81) Assessment Deteriorated Restart Zegerid 40 mg a day with standard antireflux maneuvers. Orders: TLB-CBC Platelet - w/Differential (85025-CBCD) TLB-BMP (Basic Metabolic Panel-BMET) (80048-METABOL) TLB-TSH (Thyroid Stimulating Hormone) (84443-TSH) TLB-Hepatic/Liver Function Pnl (80076-HEPATIC) TLB-B12, Serum-Total ONLY (60454-U98) TLB-Ferritin (82728-FER) TLB-Folic Acid (Folate) (82746-FOL) TLB-Iron, (Fe) Total (83540-FE) TLB-IBC Pnl (Iron/FE;Transferrin) (83550-IBC) TLB-Lipase (83690-LIPASE) TLB-Amylase (82150-AMYL) TLB-IgA (Immunoglobulin A) (82784-IGA) T-Sprue Panel (Celiac Disease Aby Eval) (83516x3/[address])  Problem # 2:  NAUSEA AND VOMITING (ICD-787.01) Assessment: Deteriorated Possibly related to migraine headaches and will use Reglan 10 mg every 6-8 hours as tolerated. I have reviewed neurological complications associated with this medication. She is tolerated well in the past without problems. She may need gastric emptying scan performed. She also may need repeat upper GI endoscopic exam. Labs have been ordered for review with office  followup in 2 weeks' time. It isn't made that the patient currently is on NSAID therapy. Orders: TLB-CBC Platelet - w/Differential (85025-CBCD) TLB-BMP (Basic Metabolic Panel-BMET) (80048-METABOL) TLB-TSH (Thyroid Stimulating Hormone) (84443-TSH) TLB-Hepatic/Liver Function Pnl (80076-HEPATIC) TLB-B12, Serum-Total ONLY (11914-N82) TLB-Ferritin (82728-FER) TLB-Folic Acid (Folate) (82746-FOL) TLB-Iron, (Fe) Total (83540-FE) TLB-IBC Pnl (Iron/FE;Transferrin) (83550-IBC) TLB-Lipase (83690-LIPASE) TLB-Amylase (82150-AMYL) TLB-IgA (Immunoglobulin A) (82784-IGA) T-Sprue Panel (Celiac Disease Aby Eval) (83516x3/[address])  Patient Instructions: 1)  Get your labs drawn today in the basement.  2)  Pick up your prescriptions from your pharmacy.  3)  Copy sent to : Herb Grays, MD and  neurology and Dr[facility] Avel [facility] 4)  The medication list was reviewed and reconciled.  All changed / newly prescribed medications were explained.  A complete medication list was provided to the patient / caregiver. 5)  Please schedule a follow-up appointment in 2 weeks.  6)  Avoid foods high in acid content ( tomatoes, citrus juices, spicy foods) . Avoid eating within 3 to 4 hours of lying down or before exercising. Do not over eat; try smaller more frequent meals. Elevate head of bed four inches when sleeping.  Prescriptions: REGLAN 10 MG TABS (METOCLOPRAMIDE HCL) one tablet by mouth every 6-8 hours as needed for Nausea and Vomiting  #120 x 2   Entered by:   Christie Nottingham CMA (AAMA)   Authorized by:   Mardella Layman MD [facility]   Signed by:   Christie Nottingham CMA (AAMA) on 11/22/2009   Method used:   Electronically to        [facility]. [phone]* (retail)       [id number]  [address]       [place], [place]  [address]       Ph: [phone]       Fax: [phone]   RxID:   [id number] ZEGERID 40-1100 MG CAPS (OMEPRAZOLE-SODIUM BICARBONATE) one capsule by mouth once daily  #30 x  11   Entered by:   Christie Nottingham CMA (AAMA)   Authorized by:   Mardella Layman MD [facility]   Signed by:   Christie Nottingham CMA (AAMA) on 11/22/2009   Method used:   Electronically to        [facility]. [phone]* (retail)       [address]       [place], [place]  [address]       Ph: [phone]       Fax: [phone]   RxID:   [id number]

## 2010-06-24 NOTE — Letter (Signed)
Summary: [facility]   Imported By: Lester Davidson 03/28/2010 07:23:01  _____________________________________________________________________  External Attachment:    Type:   Image     Comment:   External Document

## 2010-06-24 NOTE — Procedures (Signed)
Summary: EGD   EGD  Procedure date:  09/14/2006  Findings:      Location: [facility]   Patient Name: [name], [name] MRN:  Procedure Procedures: Panendoscopy (EGD) CPT: 43235.  Personnel: Endoscopist: Venita Lick. Russella Dar, MD, Clementeen Graham.  Exam Location: Exam performed in Endoscopy Suite. Inpatient[facility]  Patient Consent: Procedure, Alternatives, Risks and Benefits discussed, consent obtained, from patient. Consent was obtained by the RN.  Indications  Abnormal Exams, Studies: CT scan, abnormal.  Symptoms: Nausea. Vomiting. Abdominal pain, location: epigastric.  Comments: CT shows GOO and a Bochdalek hernia History  Current Medications: Patient is not currently taking Coumadin.  Pre-Exam Physical: Performed Sep 14, 2006  Cardio-pulmonary exam, HEENT exam, Abdominal exam, Mental status exam WNL.  Comments: Pt. history reviewed/updated, physical exam performed prior to initiation of sedation?Yes Exam Exam Info: Maximum depth of insertion Duodenum, intended Duodenum. Vocal cords not visualized. Gastric retroflexion performed. Images taken. ASA Classification: I. Tolerance: excellent.  Sedation Meds: Patient assessed and found to be appropriate for moderate (conscious) sedation. Fentanyl 50 mcg. given IV. Versed 6 mg. given IV. Cetacaine Spray 2 sprays given aerosolized.  Monitoring: BP and pulse monitoring done. Oximetry used. Supplemental O2 given  Findings Normal: Proximal Esophagus to Distal Esophagus.  MUCOSAL ABNORMALITY: Cardia to Body. Red spots present. Subepithelial hemorrhage present. Comment: Submucosal hemorrhages d/t NGT.  Normal: Antrum to Duodenal 2nd Portion.   Assessment Normal examination.  Events  Unplanned Intervention: No unplanned interventions were required.  Unplanned Events: There were no complications. Plans Disposition: After procedure patient sent to recovery. After recovery patient sent back to hospital.   Scheduling: UGI/small bowel follow through, R/O obstruction/bowel in hernia Sep 15, 2006.    cc: Herb Grays, MD     Vania Rea. Jarold Motto, MD     This report was created from the original endoscopy report, which was reviewed and signed by the above listed endoscopist.

## 2010-06-24 NOTE — Progress Notes (Signed)
Summary: Lab results  Phone Note Call from Patient Call back at Home Phone [phone]   Caller: Patient Call For: Dr. Jarold Motto Reason for Call: Lab or Test Results Summary of Call: Calling about Lab work results and has appt. sch'd for ultrasound would like to discuss Initial call taken by: Karna Christmas,  April 01, 2010 11:11 AM  Follow-up for Phone Call        patient was dxed with interstitial cystitis by a urologist she wants to know if she still needs the abdominal ultrasound Follow-up by: Harlow Mares CMA Duncan Dull),  April 01, 2010 11:37 AM  Additional Follow-up for Phone Call Additional follow up Details #1::        no Additional Follow-up by: Mardella Layman MD [facility],  April 01, 2010 12:05 PM    Additional Follow-up for Phone Call Additional follow up Details #2::    pt advised and ultrasound cancelled with [facility] in radiology. Follow-up by: Harlow Mares CMA Duncan Dull),  April 01, 2010 12:57 PM

## 2010-06-24 NOTE — Assessment & Plan Note (Signed)
Summary: monthly b12/lk  Nurse Visit   Allergies: 1)  ! * Aciphex  Medication Administration  Injection # 1:    Medication: Vit B12 1000 mcg    Diagnosis: VITAMIN B12 DEFICIENCY (ICD-266.2)    Route: IM    Site: L deltoid    Exp Date: 7/13    Lot #: [id number]    Mfr: American Regent    Patient tolerated injection without complications    Given by: Lamona Curl CMA (AAMA) (February 07, 2010 1:40 PM)  Orders Added: 1)  Vit B12 1000 mcg [J3420]

## 2010-06-24 NOTE — Medication Information (Signed)
Summary: Zegerid not covered by ins/[employer]  Zegerid not covered by ins/[employer]   Imported By: Sherian Rein 12/11/2009 09:43:30  _____________________________________________________________________  External Attachment:    Type:   Image     Comment:   External Document

## 2010-06-26 NOTE — Assessment & Plan Note (Signed)
Summary: MONTHLY B12 SHOT/JMS  Nurse Visit   Allergies: 1)  ! * Aciphex 2)  ! * Celphosporins  Medication Administration  Injection # 1:    Medication: Vit B12 1000 mcg    Diagnosis: VITAMIN B12 DEFICIENCY (ICD-266.2)    Route: IM    Site: L deltoid    Exp Date: 02/23/2012    Lot #: [id number]    Mfr: American Regent    Patient tolerated injection without complications    Given by: Jesse Fall RN (May 30, 2010 1:44 PM)  Orders Added: 1)  Vit B12 1000 mcg [J3420]

## 2010-06-27 ENCOUNTER — Ambulatory Visit: Admit: 2010-06-27 | Payer: Self-pay | Admitting: Gastroenterology

## 2010-06-27 ENCOUNTER — Encounter: Payer: Self-pay | Admitting: Gastroenterology

## 2010-06-27 ENCOUNTER — Encounter (INDEPENDENT_AMBULATORY_CARE_PROVIDER_SITE_OTHER): Payer: PRIVATE HEALTH INSURANCE

## 2010-06-27 DIAGNOSIS — E538 Deficiency of other specified B group vitamins: Secondary | ICD-10-CM

## 2010-07-02 NOTE — Assessment & Plan Note (Signed)
Summary: B12 injection  Nurse Visit   Allergies: 1)  ! * Aciphex 2)  ! * Celphosporins  Medication Administration  Injection # 1:    Medication: Vit B12 1000 mcg    Diagnosis: VITAMIN B12 DEFICIENCY (ICD-266.2)    Route: IM    Site: L deltoid    Exp Date: 03/2012    Lot #: [id number]    Mfr: American Regent    Patient tolerated injection without complications    Given by: Milford Cage NCMA (June 27, 2010 1:59 PM)  Orders Added: 1)  Vit B12 1000 mcg [J3420]

## 2010-08-14 ENCOUNTER — Ambulatory Visit (INDEPENDENT_AMBULATORY_CARE_PROVIDER_SITE_OTHER): Payer: PRIVATE HEALTH INSURANCE | Admitting: Gastroenterology

## 2010-08-14 DIAGNOSIS — E538 Deficiency of other specified B group vitamins: Secondary | ICD-10-CM

## 2010-08-14 MED ORDER — CYANOCOBALAMIN 1000 MCG/ML IJ SOLN
1000.0000 ug | INTRAMUSCULAR | Status: AC
  Administered 2010-08-14 – 2011-01-30 (×4): 1000 ug via INTRAMUSCULAR

## 2010-08-27 ENCOUNTER — Telehealth: Payer: Self-pay | Admitting: Gastroenterology

## 2010-08-27 NOTE — Telephone Encounter (Signed)
Pt with hx of IBS, GERD, B12 deficiency- completed injections 06/27/10. Last seen on 03/25/2010 and is on Reglan. Pt stated after she wakes up, 30 minutes later she has burning in her stomach and indigestion. She was on Omeprazole, but her insurance will not pay for it and she didn't try to ask for something else. Pt also mentioned she has a hx diaphragmatic hernia . Pt given appt tomorrow with Dr Jarold Motto.

## 2010-08-28 ENCOUNTER — Encounter: Payer: Self-pay | Admitting: Gastroenterology

## 2010-08-28 ENCOUNTER — Ambulatory Visit (INDEPENDENT_AMBULATORY_CARE_PROVIDER_SITE_OTHER): Payer: PRIVATE HEALTH INSURANCE | Admitting: Gastroenterology

## 2010-08-28 VITALS — BP 100/70 | HR 60 | Ht 64.0 in | Wt 128.6 lb

## 2010-08-28 DIAGNOSIS — K219 Gastro-esophageal reflux disease without esophagitis: Secondary | ICD-10-CM

## 2010-08-28 MED ORDER — DEXLANSOPRAZOLE 60 MG PO CPDR: 60.0000 mg | DELAYED_RELEASE_CAPSULE | Freq: Every day | ORAL | Status: DC

## 2010-08-28 NOTE — Progress Notes (Signed)
History of Present Illness: This is a 30 year old Caucasian female set previous repair of a diaphragmatic hernia at [facility] and was suicidal by Dr. Lily Peer. This was performed at 2008, she's done well since that time but over the last year has had a recurrence of epigastric pain with acid reflux symptoms and nausea refractory to previous attempts at PPI therapy because of severe reactions to these meds. She has been taken Reglan 10 mg at bedtime without difficulty. She continues to have rather typical reflux symptoms with regurgitation, and burning pain in her epigastric area with nausea but no vomiting. She denies other gastrointestinal or general medical problems. She specifically denies dysphagia or painful swallowing. Her bowels are regular and she denies melena or hematochezia.  Physical exam: Healthy-appearing Caucasian female appears stated age in no acute distress. I cannot appreciate stigmata of chronic liver disease. Chest is clear cardiac exam is unremarkable. There is no hepatosplenomegaly, bowel masses or tenderness. Bowel sounds are normal. Peripheral extremities are unremarkable mental status is normal.  Current Medications, Allergies, Past Medical History, Past Surgical History, Family History and Social History were reviewed in [employer] record.  Past Medical History  Diagnosis Date  . Irritable bowel syndrome   . Unspecified constipation   . Headache   . B12 deficiency   . Nausea with vomiting   . Esophageal reflux    Past Surgical History  Procedure Date  . Cholecystectomy   . Diaphragmatic hernia repair     reports that she has never smoked. She has never used smokeless tobacco. She reports that she does not drink alcohol or use illicit drugs. family history includes Cancer in her father and Heart disease in her father.  There is no history of Colon cancer. Allergies  Allergen Reactions  . Cephalosporins Hives  . Rabeprazole Sodium        Assessment and plan: Worsening acid reflux in the past history of a large diaphragmatic hernia which was repaired [facility]. I have placed her on Dexilant 60 mg at bedtime and schedule diagnostic endoscopy. Tender antireflux maneuvers also reviewed. No diagnosis found.

## 2010-08-28 NOTE — Patient Instructions (Signed)
Your prescription(s) have been sent to you pharmacy.  You were given samples of Dexilant to take once a day. Call back to schedule your endoscopy and previsit, the number is [phone].

## 2010-09-02 ENCOUNTER — Ambulatory Visit (AMBULATORY_SURGERY_CENTER): Payer: PRIVATE HEALTH INSURANCE | Admitting: *Deleted

## 2010-09-02 VITALS — Ht 64.0 in | Wt 169.0 lb

## 2010-09-02 DIAGNOSIS — K219 Gastro-esophageal reflux disease without esophagitis: Secondary | ICD-10-CM

## 2010-09-04 ENCOUNTER — Encounter: Payer: Self-pay | Admitting: Gastroenterology

## 2010-09-05 ENCOUNTER — Ambulatory Visit (AMBULATORY_SURGERY_CENTER): Payer: PRIVATE HEALTH INSURANCE | Admitting: Gastroenterology

## 2010-09-05 ENCOUNTER — Encounter: Payer: Self-pay | Admitting: Gastroenterology

## 2010-09-05 DIAGNOSIS — K219 Gastro-esophageal reflux disease without esophagitis: Secondary | ICD-10-CM

## 2010-09-05 DIAGNOSIS — R079 Chest pain, unspecified: Secondary | ICD-10-CM

## 2010-09-05 DIAGNOSIS — K3184 Gastroparesis: Secondary | ICD-10-CM

## 2010-09-05 DIAGNOSIS — D1330 Benign neoplasm of unspecified part of small intestine: Secondary | ICD-10-CM

## 2010-09-05 DIAGNOSIS — R1110 Vomiting, unspecified: Secondary | ICD-10-CM

## 2010-09-05 MED ORDER — SODIUM CHLORIDE 0.9 % IV SOLN: 500.0000 mL | INTRAVENOUS | Status: DC

## 2010-09-05 NOTE — Patient Instructions (Signed)
Follow discharge instructions and Gastroparesis diet. New prescription is eing ordered for you through Dr. Norval Gable office.New  Medication is Domperidome. Call Dr Norval Gable office for an appointment for one month.

## 2010-09-08 ENCOUNTER — Other Ambulatory Visit: Payer: Self-pay | Admitting: Gastroenterology

## 2010-09-08 ENCOUNTER — Telehealth: Payer: Self-pay | Admitting: *Deleted

## 2010-09-08 DIAGNOSIS — K589 Irritable bowel syndrome without diarrhea: Secondary | ICD-10-CM

## 2010-09-08 DIAGNOSIS — K3184 Gastroparesis: Secondary | ICD-10-CM

## 2010-09-08 MED ORDER — AMBULATORY NON FORMULARY MEDICATION: Status: DC

## 2010-09-08 NOTE — Telephone Encounter (Signed)

## 2010-09-12 ENCOUNTER — Encounter: Payer: Self-pay | Admitting: Gastroenterology

## 2010-09-18 ENCOUNTER — Ambulatory Visit (INDEPENDENT_AMBULATORY_CARE_PROVIDER_SITE_OTHER): Payer: PRIVATE HEALTH INSURANCE | Admitting: Gastroenterology

## 2010-09-18 DIAGNOSIS — E538 Deficiency of other specified B group vitamins: Secondary | ICD-10-CM

## 2010-09-25 ENCOUNTER — Ambulatory Visit (HOSPITAL_BASED_OUTPATIENT_CLINIC_OR_DEPARTMENT_OTHER)
Admission: RE | Admit: 2010-09-25 | Discharge: 2010-09-25 | Disposition: A | Discharge status: Home / Self Care | Payer: PRIVATE HEALTH INSURANCE | Source: Ambulatory Visit | Attending: Urology | Admitting: Urology

## 2010-09-25 ENCOUNTER — Other Ambulatory Visit: Payer: Self-pay | Admitting: Urology

## 2010-09-25 DIAGNOSIS — Z79899 Other long term (current) drug therapy: Secondary | ICD-10-CM | POA: Insufficient documentation

## 2010-09-25 DIAGNOSIS — N3010 Interstitial cystitis (chronic) without hematuria: Secondary | ICD-10-CM | POA: Insufficient documentation

## 2010-09-25 DIAGNOSIS — R3915 Urgency of urination: Secondary | ICD-10-CM | POA: Insufficient documentation

## 2010-09-25 DIAGNOSIS — R3989 Other symptoms and signs involving the genitourinary system: Secondary | ICD-10-CM | POA: Insufficient documentation

## 2010-09-25 DIAGNOSIS — Z8052 Family history of malignant neoplasm of bladder: Secondary | ICD-10-CM | POA: Insufficient documentation

## 2010-09-25 DIAGNOSIS — G43909 Migraine, unspecified, not intractable, without status migrainosus: Secondary | ICD-10-CM | POA: Insufficient documentation

## 2010-09-25 DIAGNOSIS — R350 Frequency of micturition: Secondary | ICD-10-CM | POA: Insufficient documentation

## 2010-09-25 DIAGNOSIS — Z01812 Encounter for preprocedural laboratory examination: Secondary | ICD-10-CM | POA: Insufficient documentation

## 2010-09-25 HISTORY — PX: OTHER SURGICAL HISTORY: SHX169

## 2010-09-26 ENCOUNTER — Ambulatory Visit: Payer: PRIVATE HEALTH INSURANCE | Admitting: Gastroenterology

## 2010-10-03 ENCOUNTER — Ambulatory Visit: Payer: PRIVATE HEALTH INSURANCE | Admitting: Gastroenterology

## 2010-10-07 NOTE — Op Note (Signed)
  NAMEGAILENE, [name]              ACCOUNT NO.:  [id number]  MEDICAL RECORD NO.:  [id number]          PATIENT TYPE:  LOCATION:                                 FACILITY:  PHYSICIAN:  Daneli Butkiewicz I. Patsi Sears, M.D.DATE OF BIRTH:  [birth year]  DATE OF PROCEDURE:  09/25/2010 DATE OF DISCHARGE:                              OPERATIVE REPORT   TIME:  9:45 a.m.  PREOPERATIVE DIAGNOSIS:  Interstitial cystitis.  POSTOPERATIVE DIAGNOSIS:  Interstitial cystitis.  OPERATION:  Cystourethroscopy, hydrodistention of bladder (500 cc), cold cup bladder biopsy with cauterization of biopsy sites, instillation of Pyridium and Marcaine in the bladder, injection of Marcaine and Kenalog in the posterior bladder base.  SURGEON:  Danie Hannig I. Patsi Sears, M.D.  ANESTHESIA:  General LMA.  PREPARATION:  After appropriate preanesthesia, the patient was brought to the operating room and placed on the operating room table in dorsal supine position where general LMA anesthesia was introduced.  She was then replaced in dorsal lithotomy position where the pubis was prepped with Betadine solution and draped in usual fashion.  REVIEW OF HISTORY:  This 30 year old female with a history of clinical interstitial cystitis, has failed medication with Cysta-Q, with constant bladder pain, migraines, urgency, frequency, negative urine culture, negative wet prep, negative STD screen.  She has had negative GYN and GI evaluations.  She voids every 30 to 60 minutes and drinks 64 ounces of water per day.  There is a family history of bladder cancer, and her current medications include Adderall, amitriptyline (weight gain), Zoloft, and Lamictal.  She has had no improvement with her symptoms, however.  She did not tolerate Toviaz because of rash.  Remeron causes facial burning and sweating.  She had urodynamics, which showed a small bladder capacity of 178 cc clinically, with a flow rate of 12 cc per second.  She was felt to  have a small capacity, hypersensitive bladder with the PUF score of 27.  DESCRIPTION OF PROCEDURE:  Cystourethroscopy was accomplished which showed a normal-appearing bladder.  Bladder was hydrodistended to 500 cc.  It would not distend more than that.  Classic interstitial cystitis ulcers were seen in the bladder, and these were biopsied and sent to laboratory for examination for mast cells.  Following this, the patient had instillation of Marcaine and Pyridium in the bladder, and after this, she had Marcaine and Kenalog injected into the subvesical space in the posterior portion. The patient tolerated the procedure well.  She received a B and O suppository at the beginning of the procedure and Toradol at the end of procedure.  She had antibiotic coverage.     Myana Schlup I. Patsi Sears, M.D.     SIT/MEDQ  D:  09/25/2010  T:  09/25/2010  Job:  [id number]  Electronically Signed by Jethro Bolus M.D. on 10/07/2010 05:10:20 PM

## 2010-10-09 ENCOUNTER — Encounter: Payer: Self-pay | Admitting: Gastroenterology

## 2010-10-09 ENCOUNTER — Ambulatory Visit (INDEPENDENT_AMBULATORY_CARE_PROVIDER_SITE_OTHER): Payer: PRIVATE HEALTH INSURANCE | Admitting: Gastroenterology

## 2010-10-09 VITALS — BP 100/60 | HR 78 | Ht 64.0 in | Wt 169.0 lb

## 2010-10-09 DIAGNOSIS — R110 Nausea: Secondary | ICD-10-CM

## 2010-10-09 NOTE — Patient Instructions (Addendum)
Make an appt to come back around 10/18/2010 for your b12 injection.  Your Gastric Empty Scan is scheduled for 11/05/2010 at 8am arrive at 7:45am at [facility]. Stop you Reglan and Dexilant 48 hours in advance.

## 2010-10-09 NOTE — Progress Notes (Signed)
This is a pleasant 30 year old Caucasian female with suspected gastroparesis and secondary acid reflux, also idiopathic B12 deficiency, and constipation predominant IBS. She has undergone repair of a diaphragmatic hernia at [facility] in [place]. He continues with some early satiety despite taking Reglan 10 mg at bedtime. She could not tolerate MiraLax or Benefiber.  Current Medications, Allergies, Past Medical History, Past Surgical History, Family History and Social History were reviewed in [employer] record.  Pertinent Review of Systems Negative   Physical Exam: Awake alert no acute distress. Abdominal exam is entirely benign without distention, organomegaly, masses or tenderness. Bowel sounds are normal, and I cannot appreciate a succussion splash. Mental status is clear there are no focal neurological deficits.    Assessment and Plan: Probable IBS, rule out gastroparesis. I have scheduled technetium gastric emptying scan before proceeding with domperidone usage. She is to continue antireflux maneuvers and Dexilant 60 mg a day. I have asked her to discontinue Reglan 10 mg at bedtime currently. Encounter Diagnosis  Name Primary?  . Nausea Yes

## 2010-10-10 NOTE — Consult Note (Signed)
[name], [name]               ACCOUNT NO.:  [id number]   MEDICAL RECORD NO.:  [id number]          PATIENT TYPE:  INP   LOCATION:  1434                         FACILITY:  [facility]   PHYSICIAN:  Adolph Pollack, M.D.DATE OF BIRTH:  07/10/1980   DATE OF CONSULTATION:  DATE OF DISCHARGE:                                 CONSULTATION   REASON:  Upper abdominal pain, nausea, vomiting.   PRESENT ILLNESS:  This 30 year old female had been having some right  upper quadrant, upper abdominal pain, began in January.  She had an  evaluation including an ultrasound which is consistent with  cholesterolosis but no gallstones.  She had a CT in January as well, but  I do not have the results of this.  It was done at [facility].  By report, apparently no significant abnormalities were  found.  HIDA scan was negative.  She subsequently underwent a  cholecystectomy about 1 month ago and had an uneventful recovery.  One  week ago she had an episode of severe upper abdominal pain with nausea  and dry heaves, but this resolved.  Today, however, she had the same  pain and it was severe and persisted.  That led her to go to the  emergency department where she was evaluated.  She underwent a CT scan  which demonstrated what appeared to be a left diaphragmatic hernia  (Bochdalek) type.  Also gastric outlet obstruction and dilated biliary  system.  She subsequently was admitted and surgical consultation has  been requested.   PAST MEDICAL HISTORY:  1. Premature birth  2. GERD.  3. Anxiety.  4. Allergies.  5. Pneumonia.   PREVIOUS OPERATIONS:  Laparoscopic cholecystectomy.   ALLERGIES:  CEPHALOSPORINS.   MEDICATIONS:  Lexapro, Xanax p.r.n., Nuvaring, Prilosec, Flonase.   SOCIAL HISTORY:  Single.  No tobacco.  Occasional alcohol use.  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   FAMILY HISTORY:  Noncontributory in her current state.   REVIEW OF SYSTEMS:   CARDIOVASCULAR:  No hypertension, heart disease.  PULMONARY:  No asthma.  GI:  No peptic ulcer disease, hepatitis.  GU:  No kidney stones.  ENDOCRINE:  No diabetes or thyroid problems.  HEMATOLOGIC:  No bleeding disorders, blood clots or transfusions.   PHYSICAL EXAM:  GENERAL:  A well-developed, well-nourished female.  She  is in no acute distress, pleasant and cooperative.  NG tube is in place.  Temperature is 97.2, blood pressure is 129/89, pulse 78.  EYES:  Extraocular motion is intact.  No icterus.  NECK:  Supple without masses.  RESPIRATORY:  Breath sounds equal and clear.  Respirations unlabored.  CARDIOVASCULAR:  Regular rate, regular rhythm.  No murmur heard.  ABDOMEN:  Soft, nontender, nondistended, and a small upper abdominal  scar is noted.  Hypoactive bowel sounds noted.  EXTREMITIES:  Good  muscle tone.  No edema.   LABORATORY DATA:  Notable for a hemoglobin 14.1, white count 11,900.  Electrolytes within normal limits except for glucose 137.  Liver  function test within normal limits.  Urine pregnancy negative.  CT scan was reviewed.   IMPRESSION:  1. 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  I do not have any      reason for it at this time.  She has been symptomatic but currently      asymptomatic after NG decompression.  2. Dilated biliary system.  Question if this is congenital too.  3. Gastric outlet obstruction, likely related to her diaphragmatic      hernia.   PLAN/RECOMMENDATION:  Discussed with Dr. Russella Dar.  Would recommend upper  endoscopy followup, and then an upper GI with small bowel follow-  through.  Based on these results, we would likely have to recommend some  sort of repair.      Adolph Pollack, M.D.  Electronically Signed     TJR/MEDQ  D:  09/14/2006  T:  09/14/2006  Job:  [id number]   cc:   Venita Lick. Russella Dar, MD, FACG  [address]  [place]  [place] [address]

## 2010-10-10 NOTE — Discharge Summary (Signed)
NAMELANISSA, CASHEN               ACCOUNT NO.:  [id number]   MEDICAL RECORD NO.:  [id number]          PATIENT TYPE:  INP   LOCATION:  1620                         FACILITY:  [facility]   PHYSICIAN:  Jordan Hawks. Elnoria Howard, MD    DATE OF BIRTH:  02-15-1981   DATE OF ADMISSION:  09/24/2006  DATE OF DISCHARGE:  09/25/2006                               DISCHARGE SUMMARY   ADMISSION DIAGNOSIS:  Left diaphragmatic hernia, Bochdalek  hernia.   DISCHARGE DIAGNOSIS:  Left diaphragmatic hernia, Bochdalek  hernia.   HISTORY OF PRESENT ILLNESS:  Please see the original H&P for full  details.   HOSPITAL COURSE:  The patient was admitted last night for complaints of  nausea vomiting and abdominal pain.  She is known to have a Bochdalek  hernia also that was diagnosed approximately 10 days ago.  At that time,  she had presented with nausea, vomiting, and abdominal pain, and rapidly  improved with placement of an NG tube.  At this time, she is improved  with the NG tube.  Unfortunately, her pain is still an issue.  Because  of the recurrence of her symptoms over a relatively short period of  time, it was felt that she would be best served with a transfer over to  [facility] for a surgical intervention.  She was originally to see  Dr. Gwendel Hanson on Oct 05, 2006, but again with the rapidity of the  recurrence of her symptoms, surgical intervention appears be warranted  at this time.  I have spoken with Dr. Lily Peer at [facility], who will accept the patient for  further treatment and evaluation.      Jordan Hawks Elnoria Howard, MD  Electronically Signed     PDH/MEDQ  D:  09/25/2006  T:  09/25/2006  Job:  [id number]   cc:   Venita Lick. Russella Dar, MD, FACG  [address]  [place]  [place] [address]   Adolph Pollack, M.D.  [address]  [place]  [place] [address]

## 2010-10-10 NOTE — H&P (Signed)
[name], [name]               ACCOUNT NO.:  [id number]   MEDICAL RECORD NO.:  [id number]          PATIENT TYPE:  INP   LOCATION:  1620                         FACILITY:  [facility]   PHYSICIAN:  Jordan Hawks. Elnoria Howard, MD    DATE OF BIRTH:  04-19-1981   DATE OF ADMISSION:  09/24/2006  DATE OF DISCHARGE:                              HISTORY & PHYSICAL   REASON FOR ADMISSION:  Abdominal pain secondary to left diaphragmatic  hernia.   HISTORY OF PRESENT ILLNESS:  This is a 30 year old female recently  diagnosed with a left diaphragmatic hernia, status post laparoscopic  cholecystectomy in January 2008, anxiety and gastroesophageal reflux  disease who was recently admitted to the hospital with complaints of  nausea, vomiting and abdominal pain. At that time, she was diagnosed  with a left diaphragmatic hernia where the proximal portion of her  stomach herniated into the left thoracic chest. An NG tube was promptly  placed in the patient and her symptoms markedly improved with NG  suctioning. She remained in the hospital for only 2 days and  subsequently was discharged. During that time in the hospital, she was  evaluated by Dr. Avel Peace and it was felt that she would benefit  from surgical correction of her diaphragmatic hernia at [facility]. At this time she has an appointment with Dr. Marilynn Rail on May  13, however, she acutely developed vomiting and abdominal pain which is  exactly the same presentation 1 week ago. She was adherent to the  recommendations of avoiding heavy foods and eating small frequent meals  and also avoiding lifting. Unfortunately despite the conservative  interventions, her symptoms have returned and an NG tube was placed in  the emergency room which has subsequently improved her abdominal pain  from a 7-8 rating to a rating of 2.   PAST MEDICAL/SURGICAL HISTORY:  As stated above.   FAMILY HISTORY:  Noncontributory.   SOCIAL HISTORY:  The patient is an  Solicitor at [employer]. No tobacco or illicit drug use.  Social alcohol use.   REVIEW OF SYSTEMS:  Negative for dizziness, blurry vision, dysphagia,  dysarthria, dysuria, fevers, chills, weight loss, weight gain, skin  rashes or new neurologic deficits. Positive for mild shortness of breath  and epigastric pain.   ALLERGIES:  CEPHALOSPORINS.   HOME MEDICATIONS:  1. Lexapro 10 mg p.o. daily.  2. Flonase spray.  3. Xanax 0.5 mg p.o. p.r.n.  4. NuvaRing.  5. Prilosec 20 mg p.o. daily.   PHYSICAL EXAMINATION:  VITAL SIGNS:  Stable.  GENERAL:  The patient is in no acute distress, alert and oriented.  HEENT: Normocephalic, atraumatic.  Extraocular muscles intact.  There is  an NG tube in the right nares.  NECK:  Supple.  No lymphadenopathy.  LUNGS:  Clear to auscultation bilaterally.  CARDIOVASCULAR: Regular rate and rhythm.  ABDOMEN:  Flat, soft, some mild tenderness in the epigastric region.  Positive bowel sounds.  EXTREMITIES:  No clubbing, cyanosis or edema.   LABORATORY VALUES:  White blood cell count is 10.2, hemoglobin 13.0, MCV  is 89.9,  platelets at 154.  Sodium 141, potassium 4.1, chloride 104, CO2  28, glucose 132, BUN 12, creatinine is 0.8. AST 16, ALT 14, total  bilirubin 1.1, phosphatase is 55.   IMPRESSION:  1. Left diaphragmatic hernia.  2. Nausea and vomiting secondary to #1.  3. Abdominal pain secondary to #1.   After evaluation, the patient is clear that she does have a recurrence  of her symptoms of the left diaphragmatic hernia which resulted in an  obstructive pattern.  She was compliant with the prescribed  recommendations for p.o. intake unfortunately her symptoms have  returned. Because of the short duration of onset of symptoms since her  discharge, I believe it is prudent at this time for the patient to be  transferred over to [facility] for further evaluation and  treatment of her diaphragmatic hernia.  She is currently  stable and is  tolerating the NG tube with marked improvement clinically; however, I  feel it would not be in her interest to be discharged home in light of  the rapid recurrence of her symptoms.   PLAN:  1. Continue with NG tube.  2. Provide IV hydration and nutrition with D5 normal saline.  3. I will attempt transfer this weekend to [facility].      Jordan Hawks. Elnoria Howard, MD  Electronically Signed     PDH/MEDQ  D:  09/24/2006  T:  09/25/2006  Job:  [id number]   cc:   Tammy R. Collins Scotland, M.D.  Fax: [phone]   Ulyess Mort, MD  [address]  [place]  [place] [address]   Venita Lick. Russella Dar, MD, FACG  [address]  [place]  [place] [address]

## 2010-10-10 NOTE — Discharge Summary (Signed)
NAMEDANETT, PALAZZO               ACCOUNT NO.:  [id number]   MEDICAL RECORD NO.:  [id number]          PATIENT TYPE:  INP   LOCATION:  1434                         FACILITY:  [facility]   PHYSICIAN:  Malcolm T. Russella Dar, MD, FACGDATE OF BIRTH:  Nov 14, 1980   DATE OF ADMISSION:  09/13/2006  DATE OF DISCHARGE:  09/15/2006                               DISCHARGE SUMMARY   ADMITTING DIAGNOSES:  58. A 30 year old white female with intermittent a gastric outlet      obstruction related to diaphragmatic/Bochdalek hernia.  2. Mild intrahepatic ductal dilation chronic, rule out possible      choledochal cyst. Normal liver function tests.  3. Status post recent laparoscopic cholecystectomy.  4. Gastroesophageal reflux disease.  5. Anxiety.   DISCHARGE DIAGNOSES:  1. Resolved gastric outlet obstruction felt secondary to herniation of      the stomach into newly diagnosed left diaphragmatic/Bochdalek      hernia.  2. Mild hypokalemia, corrected.  3. Mild intrahepatic ductal dilation chronic, rule out possible      choledochal cyst.  Normal liver function tests.  4. Status post recent laparoscopic cholecystectomy.  5. Gastroesophageal reflux disease.  6. Anxiety.   CONSULTATION:  Surgery, Dr. Abbey Chatters.   PROCEDURES:  1. CT scan of the abdomen and pelvis.  2. Upper endoscopy.  3. Upper GI and small-bowel follow-through.   BRIEF HISTORY:  Danashia is a pleasant 30 year old white female recently  known to Dr. Sheryn Bison, who had been referred to him by Dr. Collins Scotland  for evaluation of upper abdominal pain.  She was initially seen in  January 2008.  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  She  had subsequent CCK HIDA scan which was normal.  She was noted on  ultrasound to have some minimal common hepatic duct dilation without any  intrahepatic or common bile duct dilation.  She was eventually referred  to Dr.  Johna Sheriff, underwent a laparoscopic cholecystectomy approximately  one month ago.  She did well postoperatively, did have one hour's worth  of right upper quadrant pain with nausea and vomiting on September 06, 2006.  On the day of admission, September 13, 2006, she developed recurrent right  upper quadrant pain and epigastric pain which was severe and doubled her  over.  This was associated with nausea, vomiting and diaphoresis.  Pain  started about 12 noon.  She was referred to the emergency room and  symptoms resolved after the NG placement and gastric decompression.  Workup with CT of the abdomen and pelvis showed a mild intrahepatic  ductal dilation in the right and left main hepatic duct and the common  hepatic duct again raising the question of a choledochal cyst and a  distended stomach extending under the chest through a diaphragmatic  hernia on the left.  She was seen and evaluated by Dr. Russella Dar, admitted  for supportive management and further diagnostic workup.   LABORATORY STUDIES:  On the September 13, 2006, showed a wbc of 11.9.  Follow-up on September 15, 2006, wbc of 7.8, hemoglobin 14.1, hematocrit of  40.6.  Follow-up on September 15, 2006, hemoglobin 11.8, hematocrit of 34.4.  Electrolytes within normal limits. On September 14, 2006, potassium was 3.3,  this corrected to 3.9.  Liver function studies normal.  Albumin on  admission was 4.  Beta HCG was negative.  UA showed trace ketones.   X-RAY STUDIES:  CT of the abdomen and pelvis on September 13, 2006, findings  as outlined above.  Chest x-ray on September 14, 2006, showed a posterior  Bochdalek hernia of the left hemidiaphragm, no active disease.  Upper GI  small-bowel follow-through on September 15, 2006, shows the gastric fundus  and proximal body appear to herniate through a left diaphragmatic defect  without evidence of volvulus or obstruction.  The duodenum and small  bowel were unremarkable.   HOSPITAL COURSE:  The patient was admitted to the  service of Dr. Claudette Head.  Her severe pain resolved fairly quickly after NG tube was placed  and her stomach was decompressed.  CT scan had been obtained showing the  left diaphragmatic hernia.  She was seen in consultation by Dr.  Abbey Chatters for surgery.  Decision was made to further delineate her  anatomy with upper endoscopy.  This was done by Dr. Russella Dar on September 14, 2006.  This was a normal exam and she subsequently had upper GI and  small-bowel follow-through on September 15, 2006, again as outlined above.  This does show the gastric fundus and body of the stomach herniating  through the left diaphragmatic defect but there was no evidence of  volvulus or obstruction.  She was able to have her NG tube removed on  September 14, 2006.  We advanced her diet, first to clears and then full  liquids.  She tolerated this without difficulty, has had absolutely no  recurrent pain.  Dr. Abbey Chatters consulted with the thoracic surgeons and  at this time the plan is to allow the patient to be discharged to home  with arrangements for surgical correction of her hernia to be made by  Dr. Johna Sheriff and Dr. Abbey Chatters.  She may require referral to [facility],  [facility] or [facility] and Drs. Rosenbower and Hoxworth will arrange  this for the patient in a timely manner.  In the interim, she is asked  to maintain a full liquid diet with Boost or Carnation Instant Breakfast  supplements, frequent small feedings.  She is to avoid heavy lifting and  straining.  Asked to refrain from weight lifting at the gym which she  has been doing previously and to return to the ER should she have any  recurrent symptoms.  She was offered analgesics and antiemetics, but  felt that neither of these helped her symptoms in the past and therefore  will contant Dr. Johna Sheriff or return to the emergency room should she  have recurrent symtoms suggestive of gastric outlet obstruction.   OTHER MEDICATIONS:  As previous 1. Lexapro 10 mg  daily.  2. Prilosec 20 daily.  3. Flonase nasal spray daily.  4. Xanax 0.5 daily p.r.n.   CONDITION ON DISCHARGE:  Stable improved.      Amy Esterwood, PA-C      Malcolm T. Russella Dar, MD, [facility]  Electronically Signed    AE/MEDQ  D:  09/15/2006  T:  09/15/2006  Job:  [id number]   cc:   Tammy R. Collins Scotland, M.D.  Fax: [phone]   Lorne Skeens. Hoxworth, M.D.  [address]  [place]  [place] [address]   Adolph Pollack, M.D.  [address], [address]  [place]  [place] [address]

## 2010-10-10 NOTE — Assessment & Plan Note (Signed)
[facility]                         GASTROENTEROLOGY OFFICE NOTE   [name], [name]                      MRN:          [id number]  DATE:06/18/2006                            DOB:          [name], [name]    NEW PATIENT EVALUATION:  [name] is a 30 year old white female  referred through the courtesy of Dr. Yehuda Budd for evaluation of upper  abdominal pain.   This patient has really been in good health without medical problems in  the last couple of months when she has had some mild constipation that  has required Ex-Lax administration.  She really denies chronic  indigestion or heartburn, but two weeks ago had a severe attack of upper  abdominal pain with nausea and vomiting and was apparently in the  [facility] emergency room.  Lab data from that time shows a normal  CBC except for a white count of 11,600 and a normal metabolic profile  except for a blood sugar of 142 mg%.  Amylase and lipase were normal,  and pregnancy test was negative.  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  She subsequently had CT scan of the abdomen  at [facility] on June 05, 2006, which showed no  abnormalities, per Dr. Stephani Police.   Patient had rather classic biliary colic with crampy upper abdominal  pain, nausea and vomiting, but did not have icterus, clay-colored  stools, or dark urine.  She was treated with analgesics and had  resolution of her pain, although she still feels sore in her upper  abdomen.  She is taking Prilosec for mild indigestion but denies true  reflux symptoms or dysphagia.  She has had no melena or hematochezia.  She denies abuse of NSAIDs, cigarettes, or alcohol.  She never had  similar problems.   It is remarkable to me that her family history is positive for  gallbladder disease in her grandfather, who apparently also had a  diseased  gallbladder.   Her past medical history is remarkable for a questionable history of  anxiety syndrome and panic disorders.   She over the last week has been on Lexapro 10 mg a day and Xanax 0.5 mg  p.r.n.  She takes NuvaRing birth control regularly.   She in the past has had reactions to CEPHALOSPORINS.   SOCIAL HISTORY:  Patient is single and lives with her parents.  She has  a high school education.  She is an Solicitor for [employer].   Review of systems is otherwise noncontributory without any  cardiovascular, pulmonary, endocrine or neurologic problems or  gynecologic difficulties.   PHYSICAL EXAMINATION:  GENERAL:  She is a healthy, attractive-appearing  white female appearing her stated age.  She is in no acute distress.  She is 5 feet 4 inches tall and weighs 162 pounds.  VITAL SIGNS:  Blood pressure 102/72, pulse 68 and regular.  NECK:  There is no thyromegaly or lymphadenopathy noted.  CHEST:  Entirely clear.  There were no murmurs, rubs or gallops noted.  HEART:  She is in a regular rhythm.  ABDOMEN:  There is no hepatosplenomegaly, abdominal masses, or  tenderness.  Bowel sounds were normal.  EXTREMITIES:  Her upper extremities were unremarkable.  MENTAL STATUS:  Clear.  RECTAL:  Deferred.   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  There is a high correlation of cholesterolosis with  cholesterol gallstones and gallbladder disease, and I feel that she  needs laparoscopic cholecystectomy at this time.   RECOMMENDATIONS:  1. Outpatient CCK/HIDA scan.  2. Surgical referral.  3. Change from Prilosec to daily Nexium.  4. Patient will need to come to the emergency room to be seen by      surgery if she has a severe attack before her surgery could be      scheduled.     Vania Rea. Jarold Motto, MD, [facility], FAGA  Electronically  Signed    DRP/MedQ  DD: 06/18/2006  DT: 06/18/2006  Job #: [id number]   cc:   Tammy R. Collins Scotland, M.D.  Evelena Peat, M.D.  [facility]

## 2010-10-10 NOTE — H&P (Signed)
[name], [name]               ACCOUNT NO.:  [id number]   MEDICAL RECORD NO.:  [id number]          PATIENT TYPE:  INP   LOCATION:  1434                         FACILITY:  [facility]   PHYSICIAN:  Malcolm T. Russella Dar, MD, FACGDATE OF BIRTH:  August 02, 1980   DATE OF ADMISSION:  09/13/2006  DATE OF DISCHARGE:                              HISTORY & PHYSICAL   CHIEF COMPLAINT:  Severe upper abdominal pain, nausea and vomiting,  acute onset.   HISTORY:  [name] is a pleasant, generally healthy 30 year old white  female recently known to Dr. Eloise Harman who had been referred by Dr. Collins Scotland  for evaluation of upper abdominal pain in January 2008.  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  Underwent a [facility] HIDA scan which showed normal gallbladder ejection  fraction at 93%.  The ultrasound done in January did show some mild  common hepatic ductal dilation without any intrahepatic or common bile  duct dilation.  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  The patient was referred to surgery and underwent  laparoscopic cholecystectomy with Dr. Johna Sheriff approximately 4 weeks  ago.  She did well postoperatively but had one hour's worth of right  upper quadrant pain with nausea and vomiting on April14, 2008.  Today,  the day of admission, she developed recurrent right upper quadrant and  epigastric pain which was severe and doubled her over.  This started at  about 12 noon, was associated with nausea and vomiting.  She presented  to the emergency room, and her symptoms resolved after NG placement and  suctioning.  Subsequent workup with CT scan of the abdomen and pelvis  showed the mild intrahepatic ductal dilation in the right and left main  hepatic duct and the common hepatic duct, again raising the question of  a choledochal cyst.  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  She is seen and evaluated and admitted per Dr. Russella Dar for supportive  management and further diagnostic evaluation.   CURRENT MEDICATIONS:  1. Lexapro 10 mg daily.  2. Prilosec 20 mg daily.  3. Flonase nasal spray daily.  4. Xanax 0.5 daily p.r.n.  5. NuvaRing.   ALLERGIES:  CEPHALOSPORINS.   PAST MEDICAL HISTORY:  1. Anxiety.  2. Allergic rhinitis.  3. GERD.  4. Recent laparoscopic cholecystectomy.   FAMILY HISTORY:  Pertinent for grandfather with gallbladder disease.  No  other GI diseases.   SOCIAL HISTORY:  The patient is single, lives with her parents.  She is  employed as an Solicitor for [employer].  She is a  nonsmoker and drinks alcohol socially.   REVIEW OF SYSTEMS:  CARDIOVASCULAR:  Denies any chest pain or anginal  symptoms.  PULMONARY:  Negative for cough, shortness of breath, or  sputum production.  GENITOURINARY:  Negative for dysuria, urgency, or  frequency.  GI: As outlined above.  MUSCULOSKELETAL:  Negative.  NEUROLOGIC:  Negative.  The patient is alert and oriented x3.  Does have  history of anxiety.  HEENT:  Bothered by seasonal allergies and allergic  rhinitis.  All other review of systems negative.   PHYSICAL EXAMINATION:  GENERAL:  Well-developed young white female,  uncomfortable but in no acute distress after NG placement.  Alert and  oriented x3.  VITAL SIGNS:  Blood pressure 135/98 to 129/89, pulse in the 90s,  respirations 20, temperature is 94.5 on presentation, now 97.2 orally.  HEENT:  Nontraumatic, normocephalic.  EOMI, PERRLA.  Sclerae anicteric.  NECK:  Supple without nodes.  No JVD or bruit.  CARDIOVASCULAR:  Regular rate and rhythm with S1 and S2.  No murmur,  rub, or gallop.  PULMONARY:  Clear to A&P.  ABDOMEN:  Soft.  She has mild epigastric and right upper quadrant  tenderness.  There is no guarding or rebound.  No palpable mass or   hepatosplenomegaly.  Incisional ports are healing.  Bowel sounds are  present.  RECTAL:  Exam not done.  EXTREMITIES:  No clubbing, cyanosis or edema.  SKIN:  Benign.  NEUROLOGIC:  Again, alert and oriented x3 and nonfocal.   LABORATORY STUDIES:  WBC of 11.9, hemoglobin 14.1.  Lipase of 25.  LFTs  normal.  UA negative.  Urine pregnancy test negative.   IMPRESSION:  41. 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  2. Mild intrahepatic ductal dilation, chronic.  Rule out choledochal      cyst.  Normal liver function tests.  3. Status post recent laparoscopic cholecystectomy.  4. Gastroesophageal reflux disease.  5. Anxiety.   PLAN:  The patient is admitted to the service of Dr. Claudette Head.  She  will be placed on IV fluids.  We will continue NG decompression, cover  her with IV PPI and analgesics and antiemetics as needed.  We will  consult surgery for opinion and plan upper endoscopy and probable upper  GI to further delineate her anatomy in a.m.  For details, please see the  orders.      Amy Esterwood, PA-C      Malcolm T. Russella Dar, MD, [facility]  Electronically Signed    AE/MEDQ  D:  09/14/2006  T:  09/14/2006  Job:  [id number]   cc:   Adolph Pollack, M.D.  [address]  [place]  [place] [address]

## 2010-11-05 ENCOUNTER — Encounter (HOSPITAL_COMMUNITY): Payer: Self-pay

## 2010-11-05 ENCOUNTER — Telehealth: Payer: Self-pay | Admitting: *Deleted

## 2010-11-05 ENCOUNTER — Encounter (HOSPITAL_COMMUNITY)
Admission: RE | Admit: 2010-11-05 | Discharge: 2010-11-05 | Disposition: A | Discharge status: Home / Self Care | Payer: PRIVATE HEALTH INSURANCE | Source: Ambulatory Visit | Attending: Gastroenterology | Admitting: Gastroenterology

## 2010-11-05 DIAGNOSIS — K3189 Other diseases of stomach and duodenum: Secondary | ICD-10-CM | POA: Insufficient documentation

## 2010-11-05 DIAGNOSIS — R112 Nausea with vomiting, unspecified: Secondary | ICD-10-CM | POA: Insufficient documentation

## 2010-11-05 DIAGNOSIS — R110 Nausea: Secondary | ICD-10-CM

## 2010-11-05 HISTORY — DX: Gastroparesis: K31.84

## 2010-11-05 MED ORDER — AMBULATORY NON FORMULARY MEDICATION: Status: DC

## 2010-11-05 MED ORDER — TECHNETIUM TC 99M SULFUR COLLOID
2.0000 | Freq: Once | INTRAVENOUS | Status: AC | PRN
  Administered 2010-11-05: 2 via ORAL

## 2010-11-05 NOTE — Telephone Encounter (Signed)
Advised pt that she has delayed gastric emptying and she would continue reglan until she is able to start the domperidone she has asked me to send another rx for domperidone. I have once she gets the domperidone she will stop reglan and call back to schedule an office visit one month after starting domperidone. She is coming in for a b12 injection and will be given a gastroparesis diet and she is to follow step 3. Once Dr Jarold Motto returns I will call her with any further advice. She verbalized understanding.

## 2010-11-06 ENCOUNTER — Ambulatory Visit (INDEPENDENT_AMBULATORY_CARE_PROVIDER_SITE_OTHER): Payer: PRIVATE HEALTH INSURANCE | Admitting: Gastroenterology

## 2010-11-06 DIAGNOSIS — E538 Deficiency of other specified B group vitamins: Secondary | ICD-10-CM

## 2010-12-30 ENCOUNTER — Ambulatory Visit (INDEPENDENT_AMBULATORY_CARE_PROVIDER_SITE_OTHER): Payer: PRIVATE HEALTH INSURANCE | Admitting: Gastroenterology

## 2010-12-30 DIAGNOSIS — E538 Deficiency of other specified B group vitamins: Secondary | ICD-10-CM

## 2011-01-30 ENCOUNTER — Ambulatory Visit (INDEPENDENT_AMBULATORY_CARE_PROVIDER_SITE_OTHER): Payer: PRIVATE HEALTH INSURANCE | Admitting: Gastroenterology

## 2011-01-30 DIAGNOSIS — E538 Deficiency of other specified B group vitamins: Secondary | ICD-10-CM

## 2011-03-04 ENCOUNTER — Ambulatory Visit (INDEPENDENT_AMBULATORY_CARE_PROVIDER_SITE_OTHER): Payer: PRIVATE HEALTH INSURANCE | Admitting: Gastroenterology

## 2011-03-04 DIAGNOSIS — E538 Deficiency of other specified B group vitamins: Secondary | ICD-10-CM

## 2011-03-04 MED ORDER — CYANOCOBALAMIN 1000 MCG/ML IJ SOLN
1000.0000 ug | INTRAMUSCULAR | Status: DC
  Administered 2011-03-04 – 2011-10-01 (×6): 1000 ug via INTRAMUSCULAR

## 2011-04-01 ENCOUNTER — Ambulatory Visit (INDEPENDENT_AMBULATORY_CARE_PROVIDER_SITE_OTHER): Payer: PRIVATE HEALTH INSURANCE | Admitting: Gastroenterology

## 2011-04-01 ENCOUNTER — Telehealth: Payer: Self-pay

## 2011-04-01 DIAGNOSIS — E538 Deficiency of other specified B group vitamins: Secondary | ICD-10-CM

## 2011-04-01 MED ORDER — METOCLOPRAMIDE HCL 10 MG PO TABS: ORAL_TABLET | ORAL | Status: DC

## 2011-04-01 NOTE — Telephone Encounter (Signed)
Patient communicated that she is having trouble paying for her Domperidone.  Can she continue taking the Reglan, and if so, at what dose and for how long?

## 2011-04-01 NOTE — Telephone Encounter (Signed)
lmom for pt to call back.  Spoke with pt to inform her she can take the Reglan, but it does have a Black Box warning of taken [facility]. Pt stated understanding and stated she has taken it for years w/o problems. She requests I call in a refill to have on hand.

## 2011-04-01 NOTE — Telephone Encounter (Signed)
Black box warning with reglan !!! 10 mg at bed if tolerated,,,

## 2011-04-01 NOTE — Telephone Encounter (Signed)
Addended by: Florene Glen on: 04/01/2011 11:29 AM   Modules accepted: Orders

## 2011-04-01 NOTE — Telephone Encounter (Signed)
ok 

## 2011-05-15 ENCOUNTER — Ambulatory Visit (INDEPENDENT_AMBULATORY_CARE_PROVIDER_SITE_OTHER): Payer: PRIVATE HEALTH INSURANCE | Admitting: Gastroenterology

## 2011-05-15 DIAGNOSIS — E538 Deficiency of other specified B group vitamins: Secondary | ICD-10-CM

## 2011-06-12 ENCOUNTER — Ambulatory Visit (INDEPENDENT_AMBULATORY_CARE_PROVIDER_SITE_OTHER): Payer: PRIVATE HEALTH INSURANCE | Admitting: Gastroenterology

## 2011-06-12 DIAGNOSIS — E538 Deficiency of other specified B group vitamins: Secondary | ICD-10-CM

## 2011-07-17 ENCOUNTER — Ambulatory Visit (INDEPENDENT_AMBULATORY_CARE_PROVIDER_SITE_OTHER): Payer: PRIVATE HEALTH INSURANCE | Admitting: Gastroenterology

## 2011-07-17 DIAGNOSIS — E538 Deficiency of other specified B group vitamins: Secondary | ICD-10-CM

## 2011-08-12 ENCOUNTER — Ambulatory Visit (INDEPENDENT_AMBULATORY_CARE_PROVIDER_SITE_OTHER): Payer: PRIVATE HEALTH INSURANCE | Admitting: Gastroenterology

## 2011-08-12 DIAGNOSIS — E538 Deficiency of other specified B group vitamins: Secondary | ICD-10-CM

## 2011-08-12 MED ORDER — CYANOCOBALAMIN 1000 MCG/ML IJ SOLN: 1000.0000 ug | Freq: Once | INTRAMUSCULAR | Status: DC

## 2011-10-01 ENCOUNTER — Ambulatory Visit (INDEPENDENT_AMBULATORY_CARE_PROVIDER_SITE_OTHER): Payer: PRIVATE HEALTH INSURANCE | Admitting: Gastroenterology

## 2011-10-01 DIAGNOSIS — E538 Deficiency of other specified B group vitamins: Secondary | ICD-10-CM

## 2011-11-06 ENCOUNTER — Encounter: Payer: Self-pay | Admitting: Cardiology

## 2011-11-06 ENCOUNTER — Ambulatory Visit (INDEPENDENT_AMBULATORY_CARE_PROVIDER_SITE_OTHER): Payer: PRIVATE HEALTH INSURANCE | Admitting: *Deleted

## 2011-11-06 DIAGNOSIS — Z139 Encounter for screening, unspecified: Secondary | ICD-10-CM

## 2011-11-19 ENCOUNTER — Other Ambulatory Visit: Payer: Self-pay | Admitting: Urology

## 2011-11-23 MED ORDER — BUPIVACAINE HCL 0.5 % IJ SOLN: 50.0000 mL | Freq: Once | INTRAMUSCULAR | Status: AC

## 2011-12-01 ENCOUNTER — Encounter (HOSPITAL_BASED_OUTPATIENT_CLINIC_OR_DEPARTMENT_OTHER): Payer: Self-pay | Admitting: *Deleted

## 2011-12-01 NOTE — Progress Notes (Signed)
NPO AFTER MN. ARRIVES AT 0945. NEEDS HG AND URINE PREG. WILL TAKE DEXILANT AM OF SURG W/ SIP OF WATER.

## 2011-12-06 NOTE — H&P (Signed)
History of Present Illness   I was consulted by Dr. Patsi Sears regarding [name]'s interstitial cystitis. She is a 31 year old woman and her main complaint is continued urethral pain. It is daily. It is steady. It is worse if she holds it longer but generally is not relieved by voiding or associated bladder frequency. She voids every 2-3 hours during the day and gets up zero times at night. She does have dyspareunia. She used to have a little bit of vaginal dryness but has never tried Premarin cream and currently does not have that complaint.   She has failed multiple treatments. Apparently a bladder hydrodistension helped minimally. She stopped PTNS because of price. She failed rescue treatments. She failed VESIcare and Toviaz and apparently has gastroparesis. She has failed Elavil, Zoloft, Toviaz, and is no longer on Cymbalta and Atarax. She did not qualify for the TARIS study. A number of her urine cultures have been negative.   She had a CT Scan in November 2011.   Review of systems: No change in bowel or neurologic status.   She had a positive hydrodistension in May 2012 and she was hydrodistended to 500 cc with biopsies taken.   [name] on pelvic examination had no diverticulum. Her levator muscles were tender as was her urethra.  She has seen [facility].    Past Medical History Problems  1. History of  Anxiety (Symptom) 300.00 2. History of  Chronic Cystitis 595.2 3. History of  Esophageal Reflux 530.81  Surgical History Problems  1. History of  Bladder Irrigation 2. History of  Cholecystectomy 3. History of  Cystoscopy With Biopsy 4. History of  Cystoscopy With Dilation Of Bladder 5. History of  Hernia Repair  Current Meds 1. Cymbalta 30 MG Oral Capsule Delayed Release Particles; Therapy: 22Apr2013 to 2. FLUoxetine HCl 10 MG Oral Capsule; Therapy: (Recorded:18Jan2013) to 3. HydrOXYzine HCl 25 MG Oral Tablet; Take 1 tablet twice daily; Therapy: 18Jan2013 to (Last  Rx:18Jan2013)  Requested for: 18Jan2013 4. NuvaRing 0.12-0.015 MG/24HR Vaginal Ring; Therapy: 01Nov2012 to 5. Vitamin B-12 SOLN; Therapy: (Recorded:04Nov2011) to 6. Vitamin D (Ergocalciferol) 50000 UNIT Oral Capsule; Therapy: 01Nov2012 to  Allergies Medication  1. Cephalosporins 2. Aciphex TBEC 3. Elmiron CAPS 4. Pyridium TABS 5. Toviaz TB24 6. VESIcare TABS  Family History Problems  1. Paternal history of  Bladder Cancer V16.52 2. Family history of  Family Health Status - Father's Age 16 3. Family history of  Family Health Status - Mother's Age 72  Social History Problems  1. Activities Of Daily Living 2. Caffeine Use 1-2 daily 3. Exercise Habits No regular exercise at this time. She is active at work. 4. Living Independently With Spouse 5. Marital History - Currently Married 6. Never A Smoker 7. Occupation: Solicitor 8. Self-reliant In Usual Daily Activities Denied  9. History of  Alcohol Use 10. History of  Tobacco Use  Assessment Assessed  1. Chronic Interstitial Cystitis 595.1 2. Vaginal Pain 625.8  Plan Chronic Interstitial Cystitis (595.1)  1. Follow-up Schedule Surgery Office  Follow-up  Done: 25Jun2013 Muscle Weakness (728.87)  2. PT Follow-up Office  Follow-up  Requested for: 30Jul2013 08:00AM  Discussion/Summary   [name] has ongoing urethral pain and mild frequency from interstitial cystitis. She has failed multiple treatments. Dr. Patsi Sears wondered if Interstim would help her.   [name] has mainly urethral pain but not a lot of frequency and nocturia. Having said that, I went over Interstim for her mild frequency that is refractory and her  urethral pain.   Pros, cons, success and failure rates of Interstim were discussed. We talked about the test stimulation (office/operating room) and the second stage procedure. Risks were described but not limited to the risk of persistent, de novo, or worsening incontinence. Risks of  pain, bleeding, infection, and neuropathy were discussed. Risk of malfunction, migration, and breakage were discussed. Trouble-shooting, battery life, and the need for explanation and reoperation were discussed. MRI issues were discussed. The patient understands that she might not reach her treatment goal and that she might be worse following surgery.  We went over a hydrodistension but also urethral dilation.  We talked about cystoscopy/hydrodistension and instillation in detail. Pros, cons, general surgical and anesthetic risks, and other options including watchful waiting were discussed. Risks were described but not limited to pain, infection, and bleeding. The risk of bladder perforation and management were discussed. The patient understands that it is primarily a diagnostic procedure.   She would like to proceed with a dilation recognizing her options are becoming more limited.  After a thorough review of the management options for the patient's condition the patient  elected to proceed with surgical therapy as noted above. We have discussed the potential benefits and risks of the procedure, side effects of the proposed treatment, the likelihood of the patient achieving the goals of the procedure, and any potential problems that might occur during the procedure or recuperation. Informed consent has been obtained.

## 2011-12-08 ENCOUNTER — Encounter (HOSPITAL_BASED_OUTPATIENT_CLINIC_OR_DEPARTMENT_OTHER): Payer: Self-pay | Admitting: Anesthesiology

## 2011-12-08 ENCOUNTER — Ambulatory Visit (HOSPITAL_BASED_OUTPATIENT_CLINIC_OR_DEPARTMENT_OTHER)
Admission: RE | Admit: 2011-12-08 | Discharge: 2011-12-08 | Disposition: A | Discharge status: Home / Self Care | Payer: PRIVATE HEALTH INSURANCE | Source: Ambulatory Visit | Attending: Urology | Admitting: Urology

## 2011-12-08 ENCOUNTER — Ambulatory Visit (HOSPITAL_BASED_OUTPATIENT_CLINIC_OR_DEPARTMENT_OTHER): Payer: PRIVATE HEALTH INSURANCE | Admitting: Anesthesiology

## 2011-12-08 ENCOUNTER — Encounter (HOSPITAL_BASED_OUTPATIENT_CLINIC_OR_DEPARTMENT_OTHER): Payer: Self-pay | Admitting: *Deleted

## 2011-12-08 ENCOUNTER — Encounter (HOSPITAL_BASED_OUTPATIENT_CLINIC_OR_DEPARTMENT_OTHER)
Admission: RE | Disposition: A | Discharge status: Home / Self Care | Payer: Self-pay | Source: Ambulatory Visit | Attending: Urology

## 2011-12-08 DIAGNOSIS — Z79899 Other long term (current) drug therapy: Secondary | ICD-10-CM | POA: Insufficient documentation

## 2011-12-08 DIAGNOSIS — K219 Gastro-esophageal reflux disease without esophagitis: Secondary | ICD-10-CM | POA: Insufficient documentation

## 2011-12-08 DIAGNOSIS — N3010 Interstitial cystitis (chronic) without hematuria: Secondary | ICD-10-CM | POA: Insufficient documentation

## 2011-12-08 DIAGNOSIS — R3989 Other symptoms and signs involving the genitourinary system: Secondary | ICD-10-CM | POA: Insufficient documentation

## 2011-12-08 HISTORY — PX: CYSTO WITH HYDRODISTENSION: SHX5453

## 2011-12-08 HISTORY — PX: CYSTOSCOPY WITH URETHRAL DILATATION: SHX5125

## 2011-12-08 SURGERY — CYSTOSCOPY, WITH BLADDER HYDRODISTENSION
Anesthesia: General | Site: Bladder | Wound class: Clean Contaminated

## 2011-12-08 MED ORDER — CIPROFLOXACIN IN D5W 400 MG/200ML IV SOLN
400.0000 mg | INTRAVENOUS | Status: AC
  Administered 2011-12-08: 400 mg via INTRAVENOUS

## 2011-12-08 MED ORDER — PROPOFOL 10 MG/ML IV EMUL
INTRAVENOUS | Status: DC | PRN
  Administered 2011-12-08: 180 mg via INTRAVENOUS

## 2011-12-08 MED ORDER — FENTANYL CITRATE 0.05 MG/ML IJ SOLN: 25.0000 ug | INTRAMUSCULAR | Status: DC | PRN

## 2011-12-08 MED ORDER — LIDOCAINE HCL (CARDIAC) 20 MG/ML IV SOLN
INTRAVENOUS | Status: DC | PRN
  Administered 2011-12-08: 80 mg via INTRAVENOUS

## 2011-12-08 MED ORDER — KETOROLAC TROMETHAMINE 30 MG/ML IJ SOLN
INTRAMUSCULAR | Status: DC | PRN
  Administered 2011-12-08: 30 mg via INTRAVENOUS

## 2011-12-08 MED ORDER — ONDANSETRON HCL 4 MG/2ML IJ SOLN
INTRAMUSCULAR | Status: DC | PRN
  Administered 2011-12-08: 4 mg via INTRAVENOUS

## 2011-12-08 MED ORDER — LACTATED RINGERS IV SOLN
INTRAVENOUS | Status: DC
  Administered 2011-12-08: 10:00:00 via INTRAVENOUS

## 2011-12-08 MED ORDER — HYDROCODONE-ACETAMINOPHEN 5-500 MG PO TABS: 1.0000 | ORAL_TABLET | Freq: Four times a day (QID) | ORAL | Status: AC | PRN

## 2011-12-08 MED ORDER — DEXAMETHASONE SODIUM PHOSPHATE 4 MG/ML IJ SOLN
INTRAMUSCULAR | Status: DC | PRN
  Administered 2011-12-08: 10 mg via INTRAVENOUS

## 2011-12-08 MED ORDER — MIDAZOLAM HCL 5 MG/5ML IJ SOLN
INTRAMUSCULAR | Status: DC | PRN
  Administered 2011-12-08: 2 mg via INTRAVENOUS

## 2011-12-08 MED ORDER — CIPROFLOXACIN HCL 250 MG PO TABS: 250.0000 mg | ORAL_TABLET | Freq: Two times a day (BID) | ORAL | Status: AC

## 2011-12-08 MED ORDER — BUPIVACAINE HCL 0.5 % IJ SOLN
INTRAMUSCULAR | Status: DC | PRN
  Administered 2011-12-08: 15 mL

## 2011-12-08 MED ORDER — LACTATED RINGERS IV SOLN: INTRAVENOUS | Status: DC

## 2011-12-08 MED ORDER — STERILE WATER FOR IRRIGATION IR SOLN
Status: DC | PRN
  Administered 2011-12-08: 3000 mL

## 2011-12-08 MED ORDER — OXYCODONE-ACETAMINOPHEN 5-325 MG PO TABS
1.0000 | ORAL_TABLET | Freq: Once | ORAL | Status: AC
  Administered 2011-12-08: 1 via ORAL

## 2011-12-08 MED ORDER — FENTANYL CITRATE 0.05 MG/ML IJ SOLN
INTRAMUSCULAR | Status: DC | PRN
  Administered 2011-12-08 (×2): 50 ug via INTRAVENOUS

## 2011-12-08 SURGICAL SUPPLY — 22 items
BAG DRAIN URO-CYSTO SKYTR STRL (DRAIN) ×3 IMPLANT
BAG DRN UROCATH (DRAIN) ×2
CANISTER SUCT LVC 12 LTR MEDI- (MISCELLANEOUS) IMPLANT
CATH FOLEY 2WAY SLVR  5CC 18FR (CATHETERS)
CATH FOLEY 2WAY SLVR 5CC 18FR (CATHETERS) IMPLANT
CATH ROBINSON RED A/P 12FR (CATHETERS) IMPLANT
CATH ROBINSON RED A/P 14FR (CATHETERS) IMPLANT
CLOTH BEACON ORANGE TIMEOUT ST (SAFETY) ×3 IMPLANT
DRAPE CAMERA CLOSED 9X96 (DRAPES) ×3 IMPLANT
ELECT REM PT RETURN 9FT ADLT (ELECTROSURGICAL)
ELECTRODE REM PT RTRN 9FT ADLT (ELECTROSURGICAL) IMPLANT
GLOVE BIO SURGEON STRL SZ 6.5 (GLOVE) ×1 IMPLANT
GLOVE BIO SURGEON STRL SZ7.5 (GLOVE) ×3 IMPLANT
GLOVE INDICATOR 6.5 STRL GRN (GLOVE) ×2 IMPLANT
GOWN STRL REIN XL XLG (GOWN DISPOSABLE) ×3 IMPLANT
GOWN SURGICAL LARGE (GOWNS) ×2 IMPLANT
NDL SAFETY ECLIPSE 18X1.5 (NEEDLE) ×2 IMPLANT
NEEDLE HYPO 18GX1.5 SHARP (NEEDLE) ×3
PACK CYSTOSCOPY (CUSTOM PROCEDURE TRAY) ×3 IMPLANT
SUT SILK 0 TIES 10X30 (SUTURE) IMPLANT
SYR 20CC LL (SYRINGE) ×3 IMPLANT
WATER STERILE IRR 3000ML UROMA (IV SOLUTION) ×3 IMPLANT

## 2011-12-08 NOTE — Interval H&P Note (Signed)
History and Physical Interval Note:  12/08/2011 10:56 AM  [name]  has presented today for surgery, with the diagnosis of Pelvic Pain  The various methods of treatment have been discussed with the patient and family. After consideration of risks, benefits and other options for treatment, the patient has consented to  Procedure(s) (LRB): CYSTOSCOPY/HYDRODISTENSION (N/A) as a surgical intervention .  The patient's history has been reviewed, patient examined, no change in status, stable for surgery.  I have reviewed the patients' chart and labs.  Questions were answered to the patient's satisfaction.     [name]

## 2011-12-08 NOTE — Op Note (Signed)
Preoperative diagnosis: Interstitial cystitis Postoperative diagnosis: Interstitial cystitis and significant urethral pain Surgery: Cystoscopy and bladder hydrodistention and bladder installation treatments and urethral dilation Surgeon: Dr. 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 She has a lot of urethral pain. Reoperative antibiotics were given. 21 [place] scope was utilized. Bladder mucosa and trigone were normal.  She was hydrodistended only to approximately 350-400 mL. The hydrodistention was held for 5 minutes. Bladder was emptied and on reinspection she had diffuse glomerulations and no ulcers and no bladder injury  Bladder was partially emptied and she was dilated gently for 24 [place] to 67 [place] because of her urethral pain  Bladder was emptied. As a separate procedure I used a [place] catheter instilled 15 cc of 0.5% Marcaine was 400 mg of peridium

## 2011-12-08 NOTE — Anesthesia Preprocedure Evaluation (Signed)
Anesthesia Evaluation  Patient identified by MRN, date of birth, ID band Patient awake    Reviewed: Allergy & Precautions, H&P , NPO status , Patient's Chart, lab work & pertinent test results  Airway Mallampati: II TM Distance: >3 FB Neck ROM: full    Dental No notable dental hx. (+) Teeth Intact and Dental Advisory Given   Pulmonary neg pulmonary ROS,  breath sounds clear to auscultation  Pulmonary exam normal       Cardiovascular Exercise Tolerance: Good negative cardio ROS  Rhythm:regular Rate:Normal     Neuro/Psych negative neurological ROS  negative psych ROS   GI/Hepatic negative GI ROS, Neg liver ROS, GERD-  Medicated and Controlled,  Endo/Other  negative endocrine ROS  Renal/GU negative Renal ROS  negative genitourinary   Musculoskeletal   Abdominal   Peds  Hematology negative hematology ROS (+)   Anesthesia Other Findings   Reproductive/Obstetrics negative OB ROS                           Anesthesia Physical Anesthesia Plan  ASA: I  Anesthesia Plan: General   Post-op Pain Management:    Induction: Intravenous  Airway Management Planned: LMA  Additional Equipment:   Intra-op Plan:   Post-operative Plan:   Informed Consent: I have reviewed the patients History and Physical, chart, labs and discussed the procedure including the risks, benefits and alternatives for the proposed anesthesia with the patient or authorized representative who has indicated his/her understanding and acceptance.   Dental Advisory Given  Plan Discussed with: CRNA and Surgeon  Anesthesia Plan Comments:         Anesthesia Quick Evaluation  

## 2011-12-08 NOTE — Anesthesia Procedure Notes (Signed)
Procedure Name: LMA Insertion Date/Time: 12/08/2011 10:59 AM Performed by: Maris Berger T Pre-anesthesia Checklist: Patient identified, Emergency Drugs available, Suction available and Patient being monitored Patient Re-evaluated:Patient Re-evaluated prior to inductionOxygen Delivery Method: Circle System Utilized Preoxygenation: Pre-oxygenation with 100% oxygen Intubation Type: IV induction Ventilation: Mask ventilation without difficulty LMA: LMA inserted LMA Size: 4.0 Number of attempts: 1 Airway Equipment and Method: bite block Placement Confirmation: positive ETCO2 Dental Injury: Teeth and Oropharynx as per pre-operative assessment

## 2011-12-08 NOTE — Progress Notes (Signed)
Dr Lissa Hoard in to see pt in Phase II.  States pt can be discharged to home without having to urinate larger quantity.  Rx changed from hydrocodone to percocet since pt states she cannot tolerate hydrocodone.

## 2011-12-08 NOTE — Transfer of Care (Signed)
Immediate Anesthesia Transfer of Care Note  Patient: [name]  Procedure(s) Performed: Procedure(s) (LRB): CYSTOSCOPY/HYDRODISTENSION (N/A) CYSTOSCOPY WITH URETHRAL DILATATION ()  Patient Location: PACU  Anesthesia Type: General  Level of Consciousness: sedated  Airway & Oxygen Therapy: Patient Spontanous Breathing and Patient connected to nasal cannula oxygen  Post-op Assessment: Report given to PACU RN  Post vital signs: Reviewed and stable  Complications: No apparent anesthesia complications

## 2011-12-08 NOTE — Anesthesia Postprocedure Evaluation (Signed)
  Anesthesia Post-op Note  Patient: [name]  Procedure(s) Performed: Procedure(s) (LRB): CYSTOSCOPY/HYDRODISTENSION (N/A) CYSTOSCOPY WITH URETHRAL DILATATION ()  Patient Location: PACU  Anesthesia Type: General  Level of Consciousness: awake and alert   Airway and Oxygen Therapy: Patient Spontanous Breathing  Post-op Pain: mild  Post-op Assessment: Post-op Vital signs reviewed, Patient's Cardiovascular Status Stable, Respiratory Function Stable, Patent Airway and No signs of Nausea or vomiting  Post-op Vital Signs: stable  Complications: No apparent anesthesia complications

## 2011-12-09 ENCOUNTER — Encounter (HOSPITAL_BASED_OUTPATIENT_CLINIC_OR_DEPARTMENT_OTHER): Payer: Self-pay | Admitting: Urology

## 2011-12-14 ENCOUNTER — Encounter (HOSPITAL_BASED_OUTPATIENT_CLINIC_OR_DEPARTMENT_OTHER): Payer: Self-pay

## 2012-11-22 ENCOUNTER — Other Ambulatory Visit: Payer: Self-pay | Admitting: Plastic Surgery

## 2012-11-22 HISTORY — PX: BREAST REDUCTION SURGERY: SHX8

## 2013-01-03 ENCOUNTER — Ambulatory Visit (INDEPENDENT_AMBULATORY_CARE_PROVIDER_SITE_OTHER): Payer: PRIVATE HEALTH INSURANCE | Admitting: Gastroenterology

## 2013-01-03 ENCOUNTER — Encounter: Payer: Self-pay | Admitting: Gastroenterology

## 2013-01-03 VITALS — BP 118/80 | HR 94 | Ht 63.5 in | Wt 199.4 lb

## 2013-01-03 DIAGNOSIS — R109 Unspecified abdominal pain: Secondary | ICD-10-CM

## 2013-01-03 DIAGNOSIS — R112 Nausea with vomiting, unspecified: Secondary | ICD-10-CM

## 2013-01-03 MED ORDER — TRAMADOL HCL 50 MG PO TABS: 50.0000 mg | ORAL_TABLET | Freq: Four times a day (QID) | ORAL | Status: DC | PRN

## 2013-01-03 NOTE — Progress Notes (Signed)
This is a 32 year old Caucasian female status post repair of a diaphragmatic hernia at [facility] in 2008.  Since that time she's had problems with gastroparesis.  She could not afford prokinetic therapy.  She now complains of a constant stabbing sensation in her epigastric area without relationships to meals, does not awaken her from sleep, and is not described as a gnawing sensation, she denies typical reflux symptoms, GERD, or hepatobiliary complaints.  His pain bothers almost daily, and she denies lower gastrointestinal problems.  She does follow a modified gastroparesis diet.  She is status post cholecystectomy.  Current Medications, Allergies, Past Medical History, Past Surgical History, Family History and Social History were reviewed in [employer] record.  ROS: All systems were reviewed and are negative unless otherwise stated in the HPI.          Physical Exam: Healthy patient awake and alert no acute distress.  Blood pressure 118/80, pulse 64 and weight 199 with a BMI of 34.76.  Her abdomen shows a midline abdominal scar which is well healed.  There is no organomegaly, abdominal masses, tenderness, or succussion splash.  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  I've decided to repeat her endoscopy and we may need to repeat her abdominal CT scan.  I've given her some tramadol use on when necessary basis pending her workup.  She denies abuse of alcohol, cigarettes, or NSAIDs.

## 2013-01-03 NOTE — Patient Instructions (Addendum)
  You have been scheduled for an endoscopy with propofol. Please follow written instructions given to you at your visit today. If you use inhalers (even only as needed), please bring them with you on the day of your procedure. Your physician has requested that you go to www.startemmi.com and enter the access code given to you at your visit today. This web site gives a general overview about your procedure. However, you should still follow specific instructions given to you by our office regarding your preparation for the procedure.  We have sent the following medications to your pharmacy for you to pick up at your convenience: Tramadol, please use as directed  _______________________________________                                               We are excited to introduce MyChart, a new best-in-class service that provides you online access to important information in your electronic medical record. We want to make it easier for you to view your health information - all in one secure location - when and where you need it. We expect MyChart will enhance the quality of care and service we provide.  When you register for MyChart, you can:    View your test results.    Request appointments and receive appointment reminders via email.    Request medication renewals.    View your medical history, allergies, medications and immunizations.    Communicate with your physician's office through a password-protected site.    Conveniently print information such as your medication lists.  To find out if MyChart is right for you, please talk to a member of our clinical staff today. We will gladly answer your questions about this free health and wellness tool.  If you are age 32 or older and want a member of your family to have access to your record, you must provide written consent by completing a proxy form available at our office. Please speak to our clinical staff about guidelines regarding accounts  for patients younger than age 61.  As you activate your MyChart account and need any technical assistance, please call the MyChart technical support line at [phone]) 83-CHART [phone]) or email your question to [email or web address]. If you email your question(s), please include your name, a return phone number and the best time to reach you.  If you have non-urgent health-related questions, you can send a message to our office through MyChart at [email or web address]. If you have a medical emergency, call 911.  Thank you for using MyChart as your new health and wellness resource!   MyChart licensed from [employer],  [id number]. Patents Pending.

## 2013-01-03 NOTE — Addendum Note (Signed)
Addended by: Ok Anis A on: 01/03/2013 03:38 PM   Modules accepted: Orders

## 2013-01-09 ENCOUNTER — Telehealth: Payer: Self-pay | Admitting: Gastroenterology

## 2013-01-09 ENCOUNTER — Ambulatory Visit (AMBULATORY_SURGERY_CENTER): Payer: PRIVATE HEALTH INSURANCE | Admitting: Gastroenterology

## 2013-01-09 ENCOUNTER — Other Ambulatory Visit: Payer: Self-pay | Admitting: *Deleted

## 2013-01-09 ENCOUNTER — Encounter: Payer: Self-pay | Admitting: Gastroenterology

## 2013-01-09 VITALS — BP 121/79 | HR 86 | Temp 97.2°F | Resp 16 | Ht 63.0 in | Wt 199.0 lb

## 2013-01-09 DIAGNOSIS — K317 Polyp of stomach and duodenum: Secondary | ICD-10-CM

## 2013-01-09 DIAGNOSIS — K2970 Gastritis, unspecified, without bleeding: Secondary | ICD-10-CM

## 2013-01-09 DIAGNOSIS — K2990 Gastroduodenitis, unspecified, without bleeding: Secondary | ICD-10-CM

## 2013-01-09 DIAGNOSIS — K219 Gastro-esophageal reflux disease without esophagitis: Secondary | ICD-10-CM

## 2013-01-09 DIAGNOSIS — R109 Unspecified abdominal pain: Secondary | ICD-10-CM

## 2013-01-09 DIAGNOSIS — D131 Benign neoplasm of stomach: Secondary | ICD-10-CM

## 2013-01-09 DIAGNOSIS — R112 Nausea with vomiting, unspecified: Secondary | ICD-10-CM

## 2013-01-09 MED ORDER — ONDANSETRON HCL 4 MG PO TABS: ORAL_TABLET | ORAL | Status: DC

## 2013-01-09 MED ORDER — SODIUM CHLORIDE 0.9 % IV SOLN: 500.0000 mL | INTRAVENOUS | Status: DC

## 2013-01-09 MED ORDER — ONDANSETRON HCL 4 MG PO TABS: 4.0000 mg | ORAL_TABLET | Freq: Three times a day (TID) | ORAL | Status: DC | PRN

## 2013-01-09 MED ORDER — ESOMEPRAZOLE MAGNESIUM 40 MG PO CPDR: 40.0000 mg | DELAYED_RELEASE_CAPSULE | Freq: Every day | ORAL | Status: DC

## 2013-01-09 NOTE — Telephone Encounter (Signed)
Spoke with patient, she states "having bad chills,sick to stomach, thrown up x 1 and had diarrhea few times". Patient ate grilled chicken sandwich and soup, she denies any fever or pain. She does c/o "body aching,like the flu". Patient has not had any medications since she got home. Please advise.Marland KitchenMarland KitchenMarland Kitchen

## 2013-01-09 NOTE — Progress Notes (Signed)
Called to room to assist during endoscopic procedure.  Patient ID and intended procedure confirmed with present staff. Received instructions for my participation in the procedure from the performing physician.  

## 2013-01-09 NOTE — Progress Notes (Signed)
Procedure ends, to rewcovery, report given and VSS. 

## 2013-01-09 NOTE — Telephone Encounter (Signed)
Notified pt of RX for nausea Zofran sent to pharmacy, pt request [facility].

## 2013-01-09 NOTE — Op Note (Signed)
[facility] [address] [place], [address]   ENDOSCOPY PROCEDURE REPORT  PATIENT: Tracey, Morales  MR#: [id number] BIRTHDATE: 1980-09-24 , 32  yrs. old GENDER: Female ENDOSCOPIST:Hartlyn Reigel Hale Bogus, MD, Clementeen Graham REFERRED BY: Herb Grays, M.D. PROCEDURE DATE:  01/09/2013 PROCEDURE:   EGD w/ biopsy for H.pylori and EGD w/ biopsy ASA CLASS:    Class II INDICATIONS: Nausea and Epigastric pain. MEDICATION: Propofol (Diprivan) 180 mg IV TOPICAL ANESTHETIC:   Cetacaine Spray  DESCRIPTION OF PROCEDURE:   After the risks and benefits of the procedure were explained, informed consent was obtained.  The LB [id number]  endoscope was introduced through the mouth  and advanced to the second portion of the duodenum .  The instrument was slowly withdrawn as the mucosa was fully examined.      DUODENUM: The duodenal mucosa showed no abnormalities in the bulb and second portion of the duodenum.  STOMACH:::There was a 5 mm antral polyp which was removed with cold biopsy forceps.  The mucosa of the stomach showed a mild chronic gastritis.  CLO biopsy was done, and random gastric biopsies for pathologic exam also obtained.  There was no retained food mass lesions in the stomach.  Retroflex view did show a 4 cm hiatal hernia.  ESOPHAGUS: There is erosive esophagitis the distal third of the esophagus.  Otherwise examination of the esophagus was unremarkable.  There is no stricturing, ulceration, or other abnormalities.   There is erosive esophagitis the distal third of the esophagus.  Otherwise examination of the esophagus was unremarkable.  There is no stricturing, ulceration, or other abnormalities.    Retroflexed views revealed a hiatal hernia. The scope was then withdrawn from the patient and the procedure completed.  COMPLICATIONS: There were no complications.   ENDOSCOPIC IMPRESSION: 1.   The duodenal mucosa showed no abnormalities in the bulb and second  portion of the duodenum 2.   There was a 5 mm antral polyp which was removed with cold biopsy forceps.  The mucosa of the stomach showed a mild chronic gastritis.  CLO biopsy was done, and random gastric biopsies for pathologic exam also obtained.  There was no retained food mass lesions in the stomach.  Retroflex view did show a 4 cm hiatal hernia. 3.   There is erosive esophagitis the distal third of the esophagus. Otherwise examination of the esophagus was unremarkable.  There is no stricturing, ulceration, or other abnormalities.  Her symptoms are consistent with acid reflux and erosive esophagitis.   RECOMMENDATIONS: 1.  Await pathology results 2.  Continue PPI.Marland KitchenMarland KitchenNexium 40 mg a day 3. with a history of a diaphragmatic hernia repaired in 2008, she may need CT scan of the abdomen depending on her clinical course and response    _______________________________ eSigned:  Mardella Layman, MD, [facility] 01/09/2013 11:10 AM   standard discharge   PATIENT NAME:  Tracey, Morales MR#: [id number]

## 2013-01-09 NOTE — Progress Notes (Signed)
Patient did not experience any of the following events: a burn prior to discharge; a fall within the facility; wrong site/side/patient/procedure/implant event; or a hospital transfer or hospital admission upon discharge from the facility. (G8907) Patient did not have preoperative order for IV antibiotic SSI prophylaxis. (G8918)  

## 2013-01-09 NOTE — Patient Instructions (Signed)
YOU HAD AN ENDOSCOPIC PROCEDURE TODAY AT THE [facility]: Refer to the procedure report that was given to you for any specific questions about what was found during the examination.  If the procedure report does not answer your questions, please call your gastroenterologist to clarify.  If you requested that your care partner not be given the details of your procedure findings, then the procedure report has been included in a sealed envelope for you to review at your convenience later.  YOU SHOULD EXPECT: Some feelings of bloating in the abdomen. Passage of more gas than usual.  Walking can help get rid of the air that was put into your GI tract during the procedure and reduce the bloating. If you had a lower endoscopy (such as a colonoscopy or flexible sigmoidoscopy) you may notice spotting of blood in your stool or on the toilet paper. If you underwent a bowel prep for your procedure, then you may not have a normal bowel movement for a few days.  DIET: Your first meal following the procedure should be a light meal and then it is ok to progress to your normal diet.  A half-sandwich or bowl of soup is an example of a good first meal.  Heavy or fried foods are harder to digest and may make you feel nauseous or bloated.  Likewise meals heavy in dairy and vegetables can cause extra gas to form and this can also increase the bloating.  Drink plenty of fluids but you should avoid alcoholic beverages for 24 hours.  ACTIVITY: Your care partner should take you home directly after the procedure.  You should plan to take it easy, moving slowly for the rest of the day.  You can resume normal activity the day after the procedure however you should NOT DRIVE or use heavy machinery for 24 hours (because of the sedation medicines used during the test).    SYMPTOMS TO REPORT IMMEDIATELY: A gastroenterologist can be reached at any hour.  During normal business hours, 8:30 AM to 5:00 PM Monday through Friday,  call [phone].  After hours and on weekends, please call the GI answering service at [phone] who will take a message and have the physician on call contact you.     Following upper endoscopy (EGD)  Vomiting of blood or coffee ground material  New chest pain or pain under the shoulder blades  Painful or persistently difficult swallowing  New shortness of breath  Fever of 100F or higher  Black, tarry-looking stools  FOLLOW UP: If any biopsies were taken you will be contacted by phone or by letter within the next 1-3 weeks.  Call your gastroenterologist if you have not heard about the biopsies in 3 weeks.  Our staff will call the home number listed on your records the next business day following your procedure to check on you and address any questions or concerns that you may have at that time regarding the information given to you following your procedure. This is a courtesy call and so if there is no answer at the home number and we have not heard from you through the emergency physician on call, we will assume that you have returned to your regular daily activities without incident.  SIGNATURES/CONFIDENTIALITY: You and/or your care partner have signed paperwork which will be entered into your electronic medical record.  These signatures attest to the fact that that the information above on your After Visit Summary has been reviewed and is understood.    Full responsibility of the confidentiality of this discharge information lies with you and/or your care-partner.    INFORMATION ON GASTRITIS & ESOPHAGITIS GIVEN TO YOU TODAY  INFORMATION ON REFLUX GIVEN TO YOU ALSO  SAMPLES OF NEXIUM TO TAKE 40 MG DAILY ( BEST TO TAKE 30 MIN BEFORE 1ST MEAL OF THE DAY)

## 2013-01-09 NOTE — Telephone Encounter (Signed)
Zofran 4 mg every 8-12 hours.

## 2013-01-10 ENCOUNTER — Ambulatory Visit: Payer: PRIVATE HEALTH INSURANCE | Admitting: Gastroenterology

## 2013-01-10 ENCOUNTER — Telehealth: Payer: Self-pay | Admitting: *Deleted

## 2013-01-10 ENCOUNTER — Encounter: Payer: Self-pay | Admitting: Gastroenterology

## 2013-01-10 LAB — HELICOBACTER PYLORI SCREEN-BIOPSY: UREASE: NEGATIVE

## 2013-01-10 NOTE — Telephone Encounter (Signed)
  Follow up Call-  Call back number 01/09/2013 09/05/2010  Post procedure Call Back phone  # [phone]  Permission to leave phone message Yes -     Patient questions:  Do you have a fever, pain , or abdominal swelling? no Pain Score  0 *  Have you tolerated food without any problems? yes  Have you been able to return to your normal activities? yes  Do you have any questions about your discharge instructions: Diet   no Medications  no Follow up visit  no  Do you have questions or concerns about your Care? no  Actions: * If pain score is 4 or above: No action needed, pain <4. Pt states yesterday after she got home she had chills and nausea and vomiting but she fell asleep and when she woke up she was better, states used the  zofran for nausea with help. No pain or discomfort and so far this am, she is fine.  Instructed pt to call if problems or questions or if s/s return. ewm

## 2013-01-11 ENCOUNTER — Encounter: Payer: Self-pay | Admitting: Gastroenterology

## 2013-01-13 ENCOUNTER — Encounter: Payer: Self-pay | Admitting: Gastroenterology

## 2013-01-18 ENCOUNTER — Telehealth: Payer: Self-pay | Admitting: *Deleted

## 2013-01-18 ENCOUNTER — Encounter: Payer: Self-pay | Admitting: Gastroenterology

## 2013-01-18 DIAGNOSIS — Z8719 Personal history of other diseases of the digestive system: Secondary | ICD-10-CM

## 2013-01-18 DIAGNOSIS — R109 Unspecified abdominal pain: Secondary | ICD-10-CM

## 2013-01-18 DIAGNOSIS — K3184 Gastroparesis: Secondary | ICD-10-CM

## 2013-01-18 NOTE — Telephone Encounter (Signed)
Pt called back and she is still hurting ; will order CT in am.

## 2013-01-18 NOTE — Telephone Encounter (Signed)
lmom for pt to call her back. Dr Jarold Motto states her path is unremarkable. If she is still having problems, we can order a CT for her.

## 2013-01-19 NOTE — Telephone Encounter (Signed)
lmom for pt to call back

## 2013-01-19 NOTE — Telephone Encounter (Signed)
Scheduled pt for CT for 01/27/13; pt will pick up instructions and prep/contrast.

## 2013-01-20 ENCOUNTER — Other Ambulatory Visit: Payer: PRIVATE HEALTH INSURANCE

## 2013-01-27 ENCOUNTER — Ambulatory Visit (INDEPENDENT_AMBULATORY_CARE_PROVIDER_SITE_OTHER)
Admission: RE | Admit: 2013-01-27 | Discharge: 2013-01-27 | Disposition: A | Discharge status: Home / Self Care | Payer: PRIVATE HEALTH INSURANCE | Source: Ambulatory Visit | Attending: Gastroenterology | Admitting: Gastroenterology

## 2013-01-27 ENCOUNTER — Other Ambulatory Visit: Payer: Self-pay | Admitting: Gastroenterology

## 2013-01-27 ENCOUNTER — Telehealth: Payer: Self-pay | Admitting: Gastroenterology

## 2013-01-27 DIAGNOSIS — Z9889 Other specified postprocedural states: Secondary | ICD-10-CM

## 2013-01-27 DIAGNOSIS — R109 Unspecified abdominal pain: Secondary | ICD-10-CM

## 2013-01-27 DIAGNOSIS — K3184 Gastroparesis: Secondary | ICD-10-CM

## 2013-01-27 DIAGNOSIS — Z8719 Personal history of other diseases of the digestive system: Secondary | ICD-10-CM

## 2013-01-27 MED ORDER — IOHEXOL 300 MG/ML  SOLN
100.0000 mL | Freq: Once | INTRAMUSCULAR | Status: AC | PRN
  Administered 2013-01-27: 100 mL via INTRAVENOUS

## 2013-01-30 NOTE — Telephone Encounter (Signed)
Notes Recorded by Mardella Layman, MD on 01/27/2013 at 11:29 AM Scans are normal and do not show evidence of a diaphragmatic hernia. She should continue her medication for acid reflux. If she continues to be unhappy and had pain I would suggest you make repeat appointment with her surgeon at [facility] who saw her previously. Called Dr Lily Peer' ofc to schedule an appt and his scheduler if off until next week; left her a message to call. Pt had surgery in 2008 for diaphragmatic hernia repair; informed pt we will call back.Marland Kitchen

## 2013-02-08 NOTE — Telephone Encounter (Signed)
Spoke with Olegario Messier, scheduler for Dr Lily Peer and scheduled pt for an appt on 02/16/13 at 1:30pm. [facility] will mail pt instructions. Informed pt who stated understanding. Faxed ofc info to [phone], ATTN: Nurse Marcelino Duster.

## 2013-07-12 ENCOUNTER — Telehealth: Payer: Self-pay | Admitting: Gastroenterology

## 2013-07-12 NOTE — Telephone Encounter (Signed)
Attempted to reach patient and her mail box is full unable to leave a message.

## 2013-07-12 NOTE — Telephone Encounter (Signed)
Spoke with patient and she has been having problems for 2 months. In the last 2 weeks, she is worse with nausea and vomiting after eating. Offered OV with extender this week but she cannot come until 07/21/13. Scheduled her with Nicoletta Ba, PA on 07/21/13 at 9:30 AM.

## 2013-07-21 ENCOUNTER — Other Ambulatory Visit (INDEPENDENT_AMBULATORY_CARE_PROVIDER_SITE_OTHER): Payer: PRIVATE HEALTH INSURANCE

## 2013-07-21 ENCOUNTER — Encounter: Payer: Self-pay | Admitting: Physician Assistant

## 2013-07-21 ENCOUNTER — Ambulatory Visit (INDEPENDENT_AMBULATORY_CARE_PROVIDER_SITE_OTHER): Payer: PRIVATE HEALTH INSURANCE | Admitting: Physician Assistant

## 2013-07-21 VITALS — BP 108/76 | HR 96 | Ht 63.75 in | Wt 177.8 lb

## 2013-07-21 DIAGNOSIS — R112 Nausea with vomiting, unspecified: Secondary | ICD-10-CM

## 2013-07-21 DIAGNOSIS — Q790 Congenital diaphragmatic hernia: Secondary | ICD-10-CM | POA: Insufficient documentation

## 2013-07-21 DIAGNOSIS — K5900 Constipation, unspecified: Secondary | ICD-10-CM

## 2013-07-21 DIAGNOSIS — Q791 Other congenital malformations of diaphragm: Secondary | ICD-10-CM

## 2013-07-21 DIAGNOSIS — K3184 Gastroparesis: Secondary | ICD-10-CM

## 2013-07-21 DIAGNOSIS — O99891 Other specified diseases and conditions complicating pregnancy: Secondary | ICD-10-CM | POA: Insufficient documentation

## 2013-07-21 DIAGNOSIS — IMO0002 Reserved for concepts with insufficient information to code with codable children: Secondary | ICD-10-CM

## 2013-07-21 DIAGNOSIS — O9989 Other specified diseases and conditions complicating pregnancy, childbirth and the puerperium: Secondary | ICD-10-CM

## 2013-07-21 LAB — HIGH SENSITIVITY CRP: CRP, High Sensitivity: 14.08 mg/L — ABNORMAL HIGH (ref 0.000–5.000)

## 2013-07-21 MED ORDER — PANTOPRAZOLE SODIUM 40 MG PO TBEC: 40.0000 mg | DELAYED_RELEASE_TABLET | Freq: Every day | ORAL | Status: DC

## 2013-07-21 MED ORDER — LINACLOTIDE 145 MCG PO CAPS: 145.0000 ug | ORAL_CAPSULE | Freq: Every day | ORAL | Status: DC

## 2013-07-21 NOTE — Progress Notes (Addendum)
Subjective:    Patient ID: [name], female    DOB: April 17, 1981, 33 y.o.   MRN: [id number]  HPI [name]  is a pleasant 33 year old female known to Dr. Sharlett Iles with history of chronic GERD. Also has interstitial cystitis, is status post cholecystectomy and repair of a diaphragmatic hernia in 2008 done at [facility]. She has had problems with mild gastroparesis since. She underwent a workup in the summer of 2014 for complaints of nausea and vomiting and had CT of the abdomen and pelvis as well as chest done at that time which was negative. She had upper endoscopy done in August of 2014 showing erosive esophagitis and a 5 mm antral polyp. Gastric emptying scan was last done in 2012 showing 37% retention at 2 hours. She had been on Reglan at one point but this was only given  at bedtime and she did not feel it made much difference. She comes in today stating that she's been having increased problems over the past 6 weeks with frequent episodes of nausea and vomiting sometimes immediately after eating and sometimes a couple of hours after eating. She says she usually eats very little for breakfast and generally doesn't vomit in the morning but after lunch will frequently vomit. She is also been having problems with constipation going about a week between bowel movements. She has not been using any regular laxatives. She also has had some intermittent mid abdominal cramping. She is maintained on Xanax and Neurontin currently for anxiety/depression surrounding recent separation from her husband.    Review of Systems  Constitutional: Negative.   HENT: Negative.   Eyes: Negative.   Respiratory: Negative.   Cardiovascular: Negative.   Gastrointestinal: Positive for nausea, vomiting, abdominal pain and constipation.  Endocrine: Negative.   Genitourinary: Negative.   Musculoskeletal: Negative.   Allergic/Immunologic: Negative.   Neurological: Negative.   Hematological: Negative.     Psychiatric/Behavioral: Negative.    Outpatient Prescriptions Prior to Visit  Medication Sig Dispense Refill  . ALPRAZolam (XANAX) 0.25 MG tablet Take 0.25 mg by mouth as needed for sleep.      Marland Kitchen etonogestrel-ethinyl estradiol (NUVARING) 0.12-0.015 MG/24HR vaginal ring Place 1 each vaginally every 28 (twenty-eight) days. Insert vaginally and leave in place for 3 consecutive weeks, then remove for 1 week.      . ARIPiprazole (ABILIFY) 2 MG tablet Take 2 mg by mouth daily.      Marland Kitchen esomeprazole (NEXIUM) 40 MG capsule Take 1 capsule (40 mg total) by mouth daily before breakfast.  30 capsule  1  . ondansetron (ZOFRAN) 4 MG tablet Take 1 tablet (4 mg total) by mouth every 8 (eight) hours as needed (every 8-12 hours as needed). Every 8-12 hours as needed  5 tablet  0  . traMADol (ULTRAM) 50 MG tablet Take 1 tablet (50 mg total) by mouth every 6 (six) hours as needed for pain.  30 tablet  0  . Vilazodone HCl (VIIBRYD) 40 MG TABS Take 40 mg by mouth daily.       Facility-Administered Medications Prior to Visit  Medication Dose Route Frequency Provider Last Rate Last Dose  . bupivacaine (MARCAINE) 0.5 % (with pres) injection 50 mL  50 mL Infiltration Once Reece Packer, MD       Allergies  Allergen Reactions  . Cephalosporins Hives  . Rabeprazole Sodium Hives    aciphex   Patient Active Problem List   Diagnosis Date Noted  . GERD (gastroesophageal reflux disease) 08/28/2010  . IRRITABLE  BOWEL SYNDROME 03/25/2010  . CONSTIPATION 12/10/2009  . HEADACHE 12/10/2009  . VITAMIN B12 DEFICIENCY 11/27/2009  . GERD 11/22/2009  . NAUSEA AND VOMITING 11/22/2009   History  Substance Use Topics  . Smoking status: Never Smoker   . Smokeless tobacco: Never Used  . Alcohol Use: No   family history includes Cancer in her father; Heart disease in her father. There is no history of Colon cancer.     Objective:   Physical Exam white female in no acute distress, pleasant blood pressure 108/76 pulse  96 height 5 foot 3 weight 177. HEENT; nontraumatic normocephalic EOMI PERRLA sclera anicteric, Supple ;no JVD, Cardiovascular; regular rate and rhythm with S1-S2 no murmur or gallop, Pulmonary clear bilaterally, Abdomen; soft bowel sounds are active she is mildly tender in the right mid quadrant no guarding or rebound no mass or hepatosplenomegaly, Rectal; exam not done, Extremities; no clubbing cyanosis or edema skin warm and dry, Psych; mood and affect appropriate        Assessment & Plan:   #81  33 year old female with history of mild gastroparesis, chronic GERD and history of diaphragmatic hernia repair 2008 with complaints of increased frequency of nausea and vomiting over the past 4-6 weeks, constipation and mid abdominal cramping. I suspect her symptoms are a combination of IBS/constipation and gastroparesis. #2  Status post cholecystectomy #3 interstitial cystitis  Plan;  Patient is asked to purge her bowels with 3-4 doses of MiraLax in one setting then start a trial of Linzess 145 mcg by mouth every morning, patient was given samples today and will give her a prescription if she finds this helpful. Will restart a PPI, patient given prescription for Protonix 40 mg by mouth every morning. Check CRP and celiac panel Gastroparesis step 2-3 diet Followup office visit in 2-3 weeks with myself or Dr. Hilarie Fredrickson and depending on response we'll decide on need for further imaging  Addendum: Reviewed and agree with initial management. Jerene Bears, MD

## 2013-07-21 NOTE — Patient Instructions (Signed)
Please go to the basement level to have your labs drawn.  We have given you a Gastroparesis diet brochure.  We sent a procedure for Pantoprazole sodium 40 mg. (Same as Protonix.  ) We have given you samples of Linzess 145 mcg, take 1 tab daily for constipation.  To help purge your bowels you can take 2-3 doses of Miralax,  One dose is: 17 grams in 8 oz of water Gatorade.

## 2013-07-24 LAB — CELIAC PANEL 10
ENDOMYSIAL SCREEN: NEGATIVE
GLIADIN IGA: 10 U/mL (ref <20)
Gliadin IgG: 36.7 U/mL — ABNORMAL HIGH (ref <20)
IGA: 237 mg/dL (ref 69–380)
TISSUE TRANSGLUT AB: 8.1 U/mL (ref <20)
Tissue Transglutaminase Ab, IgA: 6 U/mL (ref <20)

## 2013-08-04 ENCOUNTER — Ambulatory Visit: Payer: PRIVATE HEALTH INSURANCE | Admitting: Physician Assistant

## 2013-08-22 ENCOUNTER — Ambulatory Visit: Payer: PRIVATE HEALTH INSURANCE | Admitting: Physician Assistant

## 2014-09-12 ENCOUNTER — Encounter: Payer: Self-pay | Admitting: Gastroenterology

## 2018-11-12 ENCOUNTER — Other Ambulatory Visit: Payer: Self-pay

## 2018-11-12 ENCOUNTER — Emergency Department (HOSPITAL_BASED_OUTPATIENT_CLINIC_OR_DEPARTMENT_OTHER)
Admission: EM | Admit: 2018-11-12 | Discharge: 2018-11-12 | Disposition: A | Discharge status: Home / Self Care | Payer: No Typology Code available for payment source | Attending: Emergency Medicine | Admitting: Emergency Medicine

## 2018-11-12 ENCOUNTER — Encounter (HOSPITAL_BASED_OUTPATIENT_CLINIC_OR_DEPARTMENT_OTHER): Payer: Self-pay | Admitting: Emergency Medicine

## 2018-11-12 ENCOUNTER — Emergency Department (HOSPITAL_BASED_OUTPATIENT_CLINIC_OR_DEPARTMENT_OTHER): Payer: No Typology Code available for payment source

## 2018-11-12 DIAGNOSIS — Y939 Activity, unspecified: Secondary | ICD-10-CM | POA: Insufficient documentation

## 2018-11-12 DIAGNOSIS — S29001A Unspecified injury of muscle and tendon of front wall of thorax, initial encounter: Secondary | ICD-10-CM | POA: Diagnosis present

## 2018-11-12 DIAGNOSIS — Z79899 Other long term (current) drug therapy: Secondary | ICD-10-CM | POA: Insufficient documentation

## 2018-11-12 DIAGNOSIS — Y999 Unspecified external cause status: Secondary | ICD-10-CM | POA: Insufficient documentation

## 2018-11-12 DIAGNOSIS — S20212A Contusion of left front wall of thorax, initial encounter: Secondary | ICD-10-CM | POA: Diagnosis not present

## 2018-11-12 DIAGNOSIS — Y92410 Unspecified street and highway as the place of occurrence of the external cause: Secondary | ICD-10-CM | POA: Insufficient documentation

## 2018-11-12 MED ORDER — ACETAMINOPHEN 500 MG PO TABS
1000.0000 mg | ORAL_TABLET | Freq: Once | ORAL | Status: AC
  Administered 2018-11-12: 1000 mg via ORAL
  Filled 2018-11-12: qty 2

## 2018-11-12 MED ORDER — CYCLOBENZAPRINE HCL 10 MG PO TABS: 10.0000 mg | ORAL_TABLET | Freq: Two times a day (BID) | ORAL | 0 refills | Status: AC | PRN

## 2018-11-12 MED ORDER — NAPROXEN 500 MG PO TABS: 500.0000 mg | ORAL_TABLET | Freq: Two times a day (BID) | ORAL | 0 refills | Status: DC
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
## 2023-01-26 ENCOUNTER — Telehealth: Payer: Managed Care, Other (non HMO)

## 2023-01-26 ENCOUNTER — Telehealth: Payer: Managed Care, Other (non HMO) | Admitting: Physician Assistant

## 2023-01-26 DIAGNOSIS — L03119 Cellulitis of unspecified part of limb: Secondary | ICD-10-CM | POA: Diagnosis not present

## 2023-01-26 MED ORDER — SULFAMETHOXAZOLE-TRIMETHOPRIM 800-160 MG PO TABS: 1.0000 | ORAL_TABLET | Freq: Two times a day (BID) | ORAL | 0 refills | Status: DC

## 2023-01-26 NOTE — Progress Notes (Signed)
I have spent 5 minutes in review of e-visit questionnaire, review and updating patient chart, medical decision making and response to patient.   William Cody Martin, PA-C    

## 2023-01-26 NOTE — Progress Notes (Signed)
E Visit for Cellulitis ° °We are sorry that you are not feeling well. Here is how we plan to help! ° °Based on what you shared with me it looks like you have cellulitis.  Cellulitis looks like areas of skin redness, swelling, and warmth; it develops as a result of bacteria entering under the skin. Little red spots and/or bleeding can be seen in skin, and tiny surface sacs containing fluid can occur. Fever can be present. Cellulitis is almost always on one side of a body, and the lower limbs are the most common site of involvement.  ° °I have prescribed:  Bactrim DS 1 tablet by mouth twice a day for 7 days ° °HOME CARE: ° °Take your medications as ordered and take all of them, even if the skin irritation appears to be healing.  ° °GET HELP RIGHT AWAY IF: ° °Symptoms that don't begin to go away within 48 hours. °Severe redness persists or worsens °If the area turns color, spreads or swells. °If it blisters and opens, develops yellow-brown crust or bleeds. °You develop a fever or chills. °If the pain increases or becomes unbearable.  °Are unable to keep fluids and food down. ° °MAKE SURE YOU  ° °Understand these instructions. °Will watch your condition. °Will get help right away if you are not doing well or get worse. ° °Thank you for choosing an e-visit. ° °Your e-visit answers were reviewed by a board certified advanced clinical practitioner to complete your personal care plan. Depending upon the condition, your plan could have included both over the counter or prescription medications. ° °Please review your pharmacy choice. Make sure the pharmacy is open so you can pick up prescription now. If there is a problem, you may contact your provider through MyChart messaging and have the prescription routed to another pharmacy.  Your safety is important to us. If you have drug allergies check your prescription carefully.  ° °For the next 24 hours you can use MyChart to ask questions about today's visit, request a  non-urgent call back, or ask for a work or school excuse. °You will get an email in the next two days asking about your experience. I hope that your e-visit has been valuable and will speed your recovery. ° °

## 2023-01-28 MED ORDER — DOXYCYCLINE HYCLATE 100 MG PO TABS: 100.0000 mg | ORAL_TABLET | Freq: Two times a day (BID) | ORAL | 0 refills | Status: DC

## 2023-01-28 NOTE — Addendum Note (Signed)
Addended by: Waldon Merl on: 01/28/2023 06:21 PM   Modules accepted: Orders

## 2023-08-11 ENCOUNTER — Encounter: Payer: Self-pay | Admitting: Physician Assistant

## 2023-08-30 ENCOUNTER — Telehealth: Payer: Self-pay

## 2023-08-30 NOTE — Telephone Encounter (Signed)
 Received referral from [facility] at [facility] for Chronic fatigue syndrome. Placed in sleep mailbox

## 2023-09-14 ENCOUNTER — Telehealth: Payer: Self-pay | Admitting: Physician Assistant

## 2023-09-14 DIAGNOSIS — J3489 Other specified disorders of nose and nasal sinuses: Secondary | ICD-10-CM

## 2023-09-15 NOTE — Progress Notes (Signed)
 E-Visit for Simple Cut/Laceration  We are sorry that you have had an injury. Here is how we plan to help!  It appears to me you have a small area of flaking skin in the nose due to dryness. You should apply a thin layer of moisturizer to the area, such as vaseline or aquaphor. Add saline nasal rinses to moisturize the nasal passageways. Adding a humidifier to the home, especially the bedroom while you sleep, can help as well.   HOME CARE: Clean the cut or scrape - Wash it well with soap and water . * avoid using hydrogen peroxide which may cause tissue damage, or impede wound healing.  Stop the bleeding - If your cut or scrape is bleeding, press a clean cloth or bandage firmly on the area for 20 minutes. You can also help slow the bleeding by holding the cut above the level of your heart.   Put a thin layer of Bacitracin antibiotic ointment on the cut or scrape. (this can be purchased at any local pharmacy- ask your pharmacist if you need assistance)   Cover the cut or scrape with a bandage or gauze. Keep the bandage clean and dry. Change the bandage 1 to 2 times every day until your cut or scrape heals.   Watch for signs that your cut or scrape is infected (redness, drainage, pain, warmth, swelling or fever)  Over the next 48 hours your wound should start to improve with less pain, less swelling and less redness. If you should develop increasing pain, swelling, redness, fever, pus from the wound you should be seen immediately to make sure this is not becoming infected.   WOUND CARE: Please keep a layer of antibiotic ointment (bacitracin preferred) on this wound at least twice a day for the next seven days and keep a sterile dressing over top of it. You may gently clean the wound with warm soap and water  between dressing changes.  We strongly recommend that you have a medical provider reevaluate your wound within 2 to 3 days in person to make sure that it is healing appropriately.  Thank you  for choosing an e-visit.  Your e-visit answers were reviewed by a board certified advanced clinical practitioner to complete your personal care plan. Depending upon the condition, your plan could have included both over the counter or prescription medications.  Please review your pharmacy choice. Make sure the pharmacy is open so you can pick up prescription now. If there is a problem, you may contact your provider through [employer] and have the prescription routed to another pharmacy.  Your safety is important to us . If you have drug allergies check your prescription carefully.   For the next 24 hours you can use MyChart to ask questions about today's visit, request a non-urgent call back, or ask for a work or school excuse. You will get an email in the next two days asking about your experience. I hope that your e-visit has been valuable and will speed your recovery.    I have spent 5 minutes in review of e-visit questionnaire, review and updating patient chart, medical decision making and response to patient.   Angelia Kelp, PA-C

## 2023-10-06 ENCOUNTER — Ambulatory Visit: Payer: Self-pay | Admitting: Physician Assistant

## 2023-10-28 ENCOUNTER — Ambulatory Visit: Payer: Self-pay | Admitting: Neurology

## 2023-10-28 ENCOUNTER — Encounter: Payer: Self-pay | Admitting: Neurology

## 2023-10-28 VITALS — BP 116/70 | HR 83 | Ht 63.5 in | Wt 197.0 lb

## 2023-10-28 DIAGNOSIS — Z9189 Other specified personal risk factors, not elsewhere classified: Secondary | ICD-10-CM

## 2023-10-28 DIAGNOSIS — G478 Other sleep disorders: Secondary | ICD-10-CM

## 2023-10-28 DIAGNOSIS — G4750 Parasomnia, unspecified: Secondary | ICD-10-CM

## 2023-10-28 DIAGNOSIS — E66811 Obesity, class 1: Secondary | ICD-10-CM

## 2023-10-28 DIAGNOSIS — R0689 Other abnormalities of breathing: Secondary | ICD-10-CM

## 2023-10-28 DIAGNOSIS — G4752 REM sleep behavior disorder: Secondary | ICD-10-CM

## 2023-10-28 DIAGNOSIS — R351 Nocturia: Secondary | ICD-10-CM

## 2023-10-28 DIAGNOSIS — R519 Headache, unspecified: Secondary | ICD-10-CM

## 2023-10-28 NOTE — Patient Instructions (Addendum)

## 2023-10-28 NOTE — Progress Notes (Signed)
 Subjective:    Patient ID: [name] is a 43 y.o. female.  HPI    Tracey Fairy, MD, PhD [facility] [address] [address] [place], [place] [address]  Dear [name],   I saw your patient, [name], upon your kind request in my sleep clinic today for initial consultation of her sleep disorder, in particular, concern for underlying obstructive sleep apnea.  The patient is unaccompanied today.  As you know, [name] is a 43 year old female with an underlying medical history of anxiety, depression, B12 deficiency, reflux disease, headaches, hiatal hernia, interstitial cystitis, anemia, irritable bowel syndrome, constipation, and obesity, who reports sleep disruption from parasomnias, including sleep talking and dream enactment behaviors.  She has a history of snoring.  She has trouble falling asleep and is on clonazepam, takes 0.5 mg at bedtime for the past 6 months. She has snoring and excessive daytime somnolence as well as occasional morning headaches. Her Epworth sleepiness score is 4/24, fatigue severity score is 61 out of 63.  She lives with her fianc who has noted mild sleep talking and sleep disruption from her dream enactment behavior.  She has even recorded herself.  I reviewed your office note from 08/27/2023.  Of note, she is currently on multiple medications including Wellbutrin, clonazepam, Prozac, hydroxyzine, Flexeril .  She has noted that her antidepressant medication caused her to have these parasomnias but cannot come off of her antidepressant medication at this time.  Her 9 year old stepdaughter lives with them as well.  They have 2 dogs in the household and they often sleep in the bedroom with them.  She does have a TV on in her bedroom and turns it off before falling asleep.  Bedtime is generally between 9 and 10 PM and rise time around 7 AM.  She has tried melatonin which did not help.  She has nocturia usually once per night, sometimes  less than that.  She has had occasional morning headaches.  She drinks caffeine in the form of coffee and soda, 1 serving each, no alcohol currently, she is a non-smoker.      Her Past Medical History Is Significant For: Past Medical History:  Diagnosis Date   Anxiety    B12 deficiency    Esophageal reflux    Gastroparesis    Headache(784.0)    Hernia, diaphragmatic    Hiatal hernia    Interstitial cystitis    Irritable bowel syndrome    Unspecified constipation     Her Past Surgical History Is Significant For: Past Surgical History:  Procedure Laterality Date   BOTOX INJECTION     for IC   BREAST REDUCTION SURGERY  11/2012   CHOLECYSTECTOMY  2008   CYSTO WITH HYDRODISTENSION  12/08/2011   Procedure: CYSTOSCOPY/HYDRODISTENSION;  Surgeon: Devorah Fonder, MD;  [facility]: [facility];  Service: Urology;  Laterality: N/A;  30 mins requested for this case    CYSTO/ HYDRODISTENTION/ BLADDER BX/ INSTILLATION THERAPY  09-25-2010   INTERSTITIAL CYSITIS   CYSTOSCOPY WITH URETHRAL DILATATION  12/08/2011   Procedure: CYSTOSCOPY WITH URETHRAL DILATATION;  Surgeon: Devorah Fonder, MD;  Location: [facility];  Service: Urology;;   DIAPHRAGMATIC HERNIA REPAIR  2008   HERNIA REPAIR     UPPER GASTROINTESTINAL ENDOSCOPY      Her Family History Is Significant For: Family History  Problem Relation Age of Onset   Snoring Mother    Cancer Father  bladder   Heart disease Father    Colon cancer Neg Hx     Her Social History Is Significant For: Social History   Socioeconomic History   Marital status: Legally Separated    Spouse name: Not on file   Number of children: 2   Years of education: Not on file   Highest education level: Not on file  Occupational History   Occupation: Event organiser: [employer]    Comment: Technical sales engineer  Tobacco Use   Smoking status: Never   Smokeless tobacco: Never  Vaping Use   Vaping status: Never  Used  Substance and Sexual Activity   Alcohol use: No   Drug use: No   Sexual activity: Not on file  Other Topics Concern   Not on file  Social History Narrative   Right handed   Caffeine: 2 cups/day    Lives with fiance and child   Social Drivers of Corporate investment banker Strain: Not on file  Food Insecurity: Low Risk  (08/27/2023)   Received from [facility]   Hunger Vital Sign    Worried About Running Out of Food in the Last Year: Never true    Ran Out of Food in the Last Year: Never true  Transportation Needs: No Transportation Needs (08/27/2023)   Received from [facility]    In the past 12 months, has lack of reliable transportation kept you from medical appointments, meetings, work or from getting things needed for daily living? : No  Physical Activity: Not on file  Stress: Not on file  Social Connections: Not on file    Her Allergies Are:  Allergies  Allergen Reactions   Bactrim  [Sulfamethoxazole -Trimethoprim ] Nausea And Vomiting   Cephalosporins Hives   Rabeprazole Sodium Hives    aciphex  :   Her Current Medications Are:  Outpatient Encounter Medications as of 10/28/2023  Medication Sig   buPROPion (WELLBUTRIN XL) 300 MG 24 hr tablet Take 300 mg by mouth daily.   clonazePAM (KLONOPIN) 0.5 MG tablet Take 0.5 mg by mouth at bedtime.   cyclobenzaprine  (FLEXERIL ) 10 MG tablet Take 1 tablet (10 mg total) by mouth 2 (two) times daily as needed for muscle spasms.   FLUoxetine (PROZAC) 20 MG capsule Take 20 mg by mouth daily.   hydrOXYzine (ATARAX) 25 MG tablet Take 1 tablet by mouth 3 (three) times daily as needed.   levonorgestrel (KYLEENA) 19.5 MG IUD Take 1 device by intrauterine route.   ORILISSA 150 MG TABS Take 1 tablet by mouth daily.   [DISCONTINUED] Linaclotide  (LINZESS ) 145 MCG CAPS capsule Take 1 capsule (145 mcg total) by mouth daily. (Patient not taking: Reported on 10/28/2023)   Facility-Administered Encounter Medications as of  10/28/2023  Medication   bupivacaine  (MARCAINE ) 0.5 % (with pres) injection 50 mL  :   Review of Systems:  Out of a complete 14 point review of systems, all are reviewed and negative with the exception of these symptoms as listed below:   Review of Systems  Neurological:        Patient is here alone for sleep consult. She states she talks in her sleep. She states this is caused by her medications that she takes for anxiety and depression. She stopped her medication several years ago to see if that was the cause and it was, but she cannot go without her anxiety medication. She states the main reason why is here is because she is very tired  during the day. She is disruptive at night to her fiance and herself. She also notes that she cannot breathe out of one side of her nose as well as the other. She doesn't feel her short term memory is as good either. ESS 5 FSS 61     Objective:  Neurological Exam  Physical Exam Physical Examination:   Vitals:   10/28/23 1426  BP: 116/70  Pulse: 83    General Examination: The patient is a very pleasant 43 y.o. female in no acute distress. She appears well-developed and well-nourished and well groomed.   HEENT: Normocephalic, atraumatic, pupils are equal, round and reactive to light, extraocular tracking is good without limitation to gaze excursion or nystagmus noted. Hearing is grossly intact. Face is symmetric with normal facial animation. Speech is clear with no dysarthria noted. There is no hypophonia. There is no lip, neck/head, jaw or voice tremor. Neck is supple with full range of passive and active motion. There are no carotid bruits on auscultation. Oropharynx exam reveals: mild mouth dryness, good dental hygiene and mild airway crowding, due to small airway entry.  Mallampati class II.  Neck circumference 14 5/8 inches, mild to moderate overbite noted, tongue protrudes centrally and palate elevates symmetrically.  Tonsils on the smaller  side.  Chest: Clear to auscultation without wheezing, rhonchi or crackles noted.  Heart: S1+S2+0, regular and normal without murmurs, rubs or gallops noted.   Abdomen: Soft, non-tender and non-distended.  Extremities: There is no pitting edema in the distal lower extremities bilaterally.   Skin: Warm and dry without trophic changes noted.   Musculoskeletal: exam reveals no obvious joint deformities.   Neurologically:  Mental status: The patient is awake, alert and oriented in all 4 spheres. Her immediate and remote memory, attention, language skills and fund of knowledge are appropriate. There is no evidence of aphasia, agnosia, apraxia or anomia. Speech is clear with normal prosody and enunciation. Thought process is linear. Mood is normal and affect is normal.  Cranial nerves II - XII are as described above under HEENT exam.  Motor exam: Normal bulk, strength and tone is noted. There is no obvious action or resting tremor.  Fine motor skills and coordination: grossly intact.  Cerebellar testing: No dysmetria or intention tremor. There is no truncal or gait ataxia.  Sensory exam: intact to light touch in the upper and lower extremities.  Gait, station and balance: She stands easily. No veering to one side is noted. No leaning to one side is noted. Posture is age-appropriate and stance is narrow based. Gait shows normal stride length and normal pace. No problems turning are noted.   Assessment and Plan:  In summary, [name] is a very pleasant 43 y.o.-year old female with an underlying medical history of anxiety, depression, B12 deficiency, reflux disease, headaches, hiatal hernia, interstitial cystitis, anemia, irritable bowel syndrome, constipation, and obesity, whose history and physical exam are concerning for sleep disordered breathing, particularly obstructive sleep apnea (OSA).  She reports parasomnias that are probably limiting with her antidepressant medication, particularly  her SNRI.  A laboratory attended sleep study is typically considered "gold standard" for evaluation of sleep disordered breathing and underlying organic sleep disorders in general.   I had a long chat with the patient about my findings and the diagnosis of sleep apnea, particularly OSA, its prognosis and treatment options. We talked about medical/conservative treatments, surgical interventions and non-pharmacological approaches for symptom control. I explained, in particular, the risks and ramifications of untreated  moderate to severe OSA, especially with respect to developing cardiovascular disease down the road, including congestive heart failure (CHF), difficult to treat hypertension, cardiac arrhythmias (particularly A-fib), neurovascular complications including TIA, stroke and dementia. Even type 2 diabetes has, in part, been linked to untreated OSA. Symptoms of untreated OSA may include (but may not be limited to) daytime sleepiness, nocturia (i.e. frequent nighttime urination), memory problems, mood irritability and suboptimally controlled or worsening mood disorder such as depression and/or anxiety, lack of energy, lack of motivation, physical discomfort, as well as recurrent headaches, especially morning or nocturnal headaches. We talked about the importance of maintaining a healthy lifestyle and striving for healthy weight. In addition, we talked about the importance of striving for and maintaining good sleep hygiene.  In particular, she is discouraged from watching TV in her bedroom. I recommended a sleep study at this time. I outlined the differences between a laboratory attended sleep study which is considered more comprehensive and accurate over the option of a home sleep test (HST); the latter may lead to underestimation of sleep disordered breathing in some instances and does not help with diagnosing upper airway resistance syndrome and is not accurate enough to diagnose primary central sleep apnea  typically. I outlined possible surgical and non-surgical treatment options of OSA, including the use of a positive airway pressure (PAP) device (i.e. CPAP, AutoPAP/APAP or BiPAP in certain circumstances), a custom-made dental device (aka oral appliance, which would require a referral to a specialist dentist or orthodontist typically, and is generally speaking not considered for patients with full dentures or edentulous state), upper airway surgical options, such as traditional UPPP (which is not considered a first-line treatment) or the Inspire device (hypoglossal nerve stimulator, which would involve a referral for consultation with an ENT surgeon, after careful selection, following inclusion criteria - also not first-line treatment). I explained the PAP treatment option to the patient in detail, as this is generally considered first-line treatment.  The patient indicated that she would be willing to try PAP therapy, if the need arises. I explained the importance of being compliant with PAP treatment, not only for insurance purposes but primarily to improve patient's symptoms symptoms, and for the patient's long term health benefit, including to reduce Her cardiovascular risks longer-term.    We will pick up our discussion about the next steps and treatment options after testing.  We will keep her posted as to the test results by phone call and/or MyChart messaging where possible.  We will plan to follow-up in sleep clinic accordingly as well.  I answered all her questions today and the patient was in agreement.   I encouraged her to call with any interim questions, concerns, problems or updates or email us  through MyChart.  Generally speaking, sleep test authorizations may take up to 2 weeks, sometimes less, sometimes longer, the patient is encouraged to get in touch with us  if they do not hear back from the sleep lab staff directly within the next 2 weeks.  Thank you very much for allowing me to  participate in the care of this nice patient. If I can be of any further assistance to you please do not hesitate to call me at [phone].  Sincerely,   Tracey Fairy, MD, PhD

## 2023-11-12 ENCOUNTER — Telehealth: Payer: Self-pay | Admitting: Neurology

## 2023-11-12 NOTE — Telephone Encounter (Signed)
 NPSG & HST PHCS no auth req for either codes. Spoke to [place] [facility] ref # [id number].  Sent mychart

## 2023-12-09 ENCOUNTER — Encounter

## 2024-05-03 ENCOUNTER — Telehealth: Payer: Self-pay | Admitting: Neurology

## 2024-05-03 NOTE — Telephone Encounter (Signed)
 Request for an appointment, pt states her headaches are now all day and not just in the morning, pt's PCP suggested she call and make an appointment.

## 2024-05-10 ENCOUNTER — Encounter: Payer: Self-pay | Admitting: Neurology

## 2024-05-10 ENCOUNTER — Ambulatory Visit (INDEPENDENT_AMBULATORY_CARE_PROVIDER_SITE_OTHER): Admitting: Neurology

## 2024-05-10 VITALS — BP 118/76 | HR 79 | Ht 63.0 in | Wt 183.0 lb

## 2024-05-10 DIAGNOSIS — G478 Other sleep disorders: Secondary | ICD-10-CM | POA: Diagnosis not present

## 2024-05-10 DIAGNOSIS — G479 Sleep disorder, unspecified: Secondary | ICD-10-CM | POA: Diagnosis not present

## 2024-05-10 DIAGNOSIS — R519 Headache, unspecified: Secondary | ICD-10-CM

## 2024-05-10 DIAGNOSIS — G43909 Migraine, unspecified, not intractable, without status migrainosus: Secondary | ICD-10-CM

## 2024-05-10 NOTE — Patient Instructions (Signed)
 Continue to hydrate well with water , try to aim for 64 ounces of water  per day. Talk to your primary care about the need for vitamin B12 injections on an ongoing basis.  Your level was quite high lately. Continue with iron supplementation and vitamin D. Follow through with the cardiology referral. We will proceed with a brain MRI and call you with results. We will proceed with your home sleep test and call you with the results and consider AutoPap therapy if you have obstructive sleep apnea.   We will follow-up in this clinic as needed.  Feel free to trial the sumatriptan as needed, as prescribed by your primary care for acute migraines. If you have acute chest pain and shortness of breath, please proceed to the emergency room immediately.

## 2024-05-10 NOTE — Progress Notes (Signed)
 Subjective:    Patient ID: [name] is a 43 y.o. female.  HPI    Interim history:   [name] is a 43 year old female with an underlying medical history of anxiety, depression, B12 deficiency, vitamin D deficiency, iron deficiency, reflux disease, headaches, hiatal hernia, interstitial cystitis, anemia, irritable bowel syndrome, constipation, and obesity, who presents for evaluation of a new problem visit of recurrent headaches.  I had evaluated her in June 2025 for sleep apnea concern and parasomnias.  She did not proceed with sleep testing at the time.  She saw her primary care provider with [facility] and a telemedicine appointment on 05/01/2024 and I reviewed the note.  She reported worsening headaches at the time.  She was given a Toradol  injection, Decadron  and Phenergan.  She was started on sumatriptan as needed.  Today, 05/10/2024: She reports recurrent headaches for the past few months.  She reports that headaches started getting worse after she had COVID this year.  She has bifrontal headaches which are sometimes throbbing, often times pressure-like, sometimes she has nausea.  She has other symptoms including intermittent chest pain and heat intolerance.  She has been referred to cardiology for this.  She has been suspected to have POTS although she denies any palpitations.  She still has sleep talking episodes, never proceeded with the home sleep test or in-lab sleep study because of insurance coverage unclear.  She is encouraged to talk to our sleep lab staff on her way out today to clarify if she could proceed with a home sleep test.  She tries to hydrate well, she feels that water  increases her nausea.  She limits caffeine.  She takes over-the-counter ibuprofen or Tylenol , maybe once or twice a week.  She has not tried the sumatriptan yet.  She had blood work about a week ago, vitamin B12 was above 1500 and she was advised to continue with once a month B12 injections.  She has  been taking vitamin D.  Level was 43.1 earlier this month.  She has a low ferritin and has been on iron supplementation which was recently increased.  She is up-to-date with her eye examination, had an appointment in February this year, prescription remained the same. She also reports cognitive fogginess.  Previously:   10/28/2023: (She) reports sleep disruption from parasomnias, including sleep talking and dream enactment behaviors.  She has a history of snoring.  She has trouble falling asleep and is on clonazepam, takes 0.5 mg at bedtime for the past 6 months. She has snoring and excessive daytime somnolence as well as occasional morning headaches. Her Epworth sleepiness score is 4/24, fatigue severity score is 61 out of 63.  She lives with her fianc who has noted mild sleep talking and sleep disruption from her dream enactment behavior.  She has even recorded herself.  I reviewed your office note from 08/27/2023.  Of note, she is currently on multiple medications including Wellbutrin, clonazepam, Prozac, hydroxyzine, Flexeril .  She has noted that her antidepressant medication caused her to have these parasomnias but cannot come off of her antidepressant medication at this time.  Her 26 year old stepdaughter lives with them as well.  They have 2 dogs in the household and they often sleep in the bedroom with them.  She does have a TV on in her bedroom and turns it off before falling asleep.  Bedtime is generally between 9 and 10 PM and rise time around 7 AM.  She has tried melatonin which did not help.  She has nocturia usually once per night, sometimes less than that.  She has had occasional morning headaches.  She drinks caffeine in the form of coffee and soda, 1 serving each, no alcohol currently, she is a non-smoker.    Her Past Medical History Is Significant For: Past Medical History:  Diagnosis Date   Anxiety    B12 deficiency    Esophageal reflux    Gastroparesis    Headache(784.0)    Hernia,  diaphragmatic    Hiatal hernia    Interstitial cystitis    Irritable bowel syndrome    Unspecified constipation     Her Past Surgical History Is Significant For: Past Surgical History:  Procedure Laterality Date   BOTOX INJECTION     for IC   BREAST REDUCTION SURGERY  11/2012   CHOLECYSTECTOMY  2008   CYSTO WITH HYDRODISTENSION  12/08/2011   Procedure: CYSTOSCOPY/HYDRODISTENSION;  Surgeon: Glendia DELENA Elizabeth, MD;  Location: [facility];  Service: Urology;  Laterality: N/A;  30 mins requested for this case    CYSTO/ HYDRODISTENTION/ BLADDER BX/ INSTILLATION THERAPY  09-25-2010   INTERSTITIAL CYSITIS   CYSTOSCOPY WITH URETHRAL DILATATION  12/08/2011   Procedure: CYSTOSCOPY WITH URETHRAL DILATATION;  Surgeon: Glendia DELENA Elizabeth, MD;  Location: [facility];  Service: Urology;;   DIAPHRAGMATIC HERNIA REPAIR  2008   HERNIA REPAIR     UPPER GASTROINTESTINAL ENDOSCOPY      Her Family History Is Significant For: Family History  Problem Relation Age of Onset   Snoring Mother    Cancer Father        bladder   Heart disease Father    Colon cancer Neg Hx     Her Social History Is Significant For: Social History   Socioeconomic History   Marital status: Legally Separated    Spouse name: Not on file   Number of children: 2   Years of education: Not on file   Highest education level: Not on file  Occupational History   Occupation: Event Organiser: [employer]    Comment: Technical sales engineer  Tobacco Use   Smoking status: Never   Smokeless tobacco: Never  Vaping Use   Vaping status: Never Used  Substance and Sexual Activity   Alcohol use: No   Drug use: No   Sexual activity: Not on file  Other Topics Concern   Not on file  Social History Narrative   Right handed   Caffeine: 2 cups/day    Lives with fiance and child   Pt works    Social Drivers of Health   Tobacco Use: Low Risk (05/10/2024)   Patient History    Smoking Tobacco Use: Never     Smokeless Tobacco Use: Never    Passive Exposure: Not on file  Financial Resource Strain: Not on file  Food Insecurity: Low Risk (05/01/2024)   Received from [facility]   Epic    Within the past 12 months, you worried that your food would run out before you got money to buy more: Never true    Within the past 12 months, the food you bought just didn't last and you didn't have money to get more. : Never true  Transportation Needs: No Transportation Needs (05/01/2024)   Received from [facility]    In the past 12 months, has lack of reliable transportation kept you from medical appointments, meetings, work or from getting things needed for daily living? : No  Physical Activity: Not on file  Stress: Not on file  Social Connections: Not on file  Depression (EYV7-0): Not on file  Alcohol Screen: Not on file  Housing: Low Risk (05/01/2024)   Received from [facility]   Epic    What is your living situation today?: I have a steady place to live    Think about the place you live. Do you have problems with any of the following? Choose all that apply:: None/None on this list  Utilities: Low Risk (05/01/2024)   Received from [facility]   Utilities    In the past 12 months has the electric, gas, oil, or water  company threatened to shut off services in your home? : No  Health Literacy: Not on file    Her Allergies Are:  Allergies[1]:   Her Current Medications Are:  Outpatient Encounter Medications as of 05/10/2024  Medication Sig   buPROPion (WELLBUTRIN XL) 300 MG 24 hr tablet Take 300 mg by mouth daily.   clonazePAM (KLONOPIN) 0.5 MG tablet Take 0.5 mg by mouth at bedtime.   FLUoxetine (PROZAC) 20 MG capsule Take 20 mg by mouth daily.   hydrOXYzine (ATARAX) 25 MG tablet Take 1 tablet by mouth 3 (three) times daily as needed.   levonorgestrel (KYLEENA) 19.5 MG IUD Take 1 device by intrauterine route.   ORILISSA 150 MG TABS Take 1 tablet by mouth daily.    Cyanocobalamin  1000 MCG/ML LIQD Take by mouth.   cyclobenzaprine  (FLEXERIL ) 10 MG tablet Take 1 tablet (10 mg total) by mouth 2 (two) times daily as needed for muscle spasms.   Facility-Administered Encounter Medications as of 05/10/2024  Medication   bupivacaine  (MARCAINE ) 0.5 % (with pres) injection 50 mL  :  Review of Systems:  Out of a complete 14 point review of systems, all are reviewed and negative with the exception of these symptoms as listed below:  Review of Systems  Objective:  Neurological Exam  Physical Exam Physical Examination:   Vitals:   05/10/24 1049  BP: 118/76  Pulse: 79    General Examination: The patient is a very pleasant 43 y.o. female in no acute distress. She appears well-developed and well-nourished and well groomed.   HEENT: Normocephalic, atraumatic, pupils are equal, round and reactive to light, extraocular tracking is good without limitation to gaze excursion or nystagmus noted. Hearing is grossly intact. Face is symmetric with normal facial animation and normal facial sensation to light touch, temperature and vibration sense. Speech is clear with no dysarthria noted. There is no hypophonia. There is no lip, neck/head, jaw or voice tremor. Neck is supple with full range of passive and active motion. There are no carotid bruits on auscultation.    Chest: Clear to auscultation without wheezing, rhonchi or crackles noted.   Heart: S1+S2+0, regular and normal without murmurs, rubs or gallops noted.    Abdomen: Soft, non-tender and non-distended.   Extremities: There is no pitting edema in the distal lower extremities bilaterally.    Skin: Warm and dry without trophic changes noted.    Musculoskeletal: exam reveals no obvious joint deformities.    Neurologically:  Mental status: The patient is awake, alert and oriented in all 4 spheres. Her immediate and remote memory, attention, language skills and fund of knowledge are appropriate. There is no  evidence of aphasia, agnosia, apraxia or anomia. Speech is clear with normal prosody and enunciation. Thought process is linear. Mood is normal and affect is normal.  Cranial nerves II - XII  are as described above under HEENT exam.  Motor exam: Normal bulk, strength and tone is noted. There is no obvious action or resting tremor.  No drift or rebound, no postural or intention tremor.  No drift or rebound, no postural or intention tremor. Reflexes 2+ throughout, toes are downgoing bilaterally. Fine motor skills and coordination: intact finger taps, hand movements and rapid alternating patting with both upper extremities, normal foot taps bilaterally in the lower extremities.  Romberg negative. Cerebellar testing: No dysmetria or intention tremor. There is no truncal or gait ataxia.  Normal finger-to-nose, normal heel-to-shin bilaterally. Sensory exam: intact to light touch, temperature and vibration sense in the upper and lower extremities.  Gait, station and balance: She stands easily. No veering to one side is noted. No leaning to one side is noted. Posture is age-appropriate and stance is narrow based. Gait shows normal stride length and normal pace. No problems turning are noted.  Normal tandem walk.   Assessment and Plan:  In summary, [name] is a with an underlying medical history of anxiety, depression, B12 deficiency, vitamin D deficiency, iron deficiency, reflux disease, headaches, hiatal hernia, interstitial cystitis, anemia, irritable bowel syndrome, constipation, and obesity, who presents for evaluation of her recurrent headaches.  History is not telltale for chronic migraines, she may have tension headaches and stress related headaches, sleep disturbance and underlying sleep disordered breathing not fully excluded.  We talked about headache triggers and alleviating factors.  She recently had blood work and is advised to talk to her PCP about the need for ongoing vitamin B12  supplementation.  She has low ferritin and continues to take iron and continues to take vitamin D which has been low normal lately.  Neurological exam is nonfocal and she is largely reassured.  She is up-to-date with her eye examination and is typically well-hydrated.  We will proceed with a home sleep test as previously planned.  We will also proceed with a brain MRI to rule out a structural cause of her headaches.  She is advised to trial the sumatriptan as needed, as prescribed by her PCP. We will keep her posted as to her test results by phone call for now and follow-up in this clinic if needed.  I answered all her questions today and she was in agreement.  I spent 40 minutes in total face-to-face time and in reviewing records during pre-charting, more than 50% of which was spent in counseling and coordination of care, reviewing test results, reviewing medications and treatment regimen and/or in discussing or reviewing the diagnosis of recurrent headaches, the prognosis and treatment options. Pertinent laboratory and imaging test results that were available during this visit with the patient were reviewed by me and considered in my medical decision making (see chart for details).      [1]  Allergies Allergen Reactions   Bactrim  [Sulfamethoxazole -Trimethoprim ] Nausea And Vomiting   Cephalosporins Hives   Rabeprazole Sodium Hives    aciphex

## 2024-06-07 ENCOUNTER — Ambulatory Visit: Admitting: Neurology

## 2024-06-07 DIAGNOSIS — G478 Other sleep disorders: Secondary | ICD-10-CM

## 2024-06-07 DIAGNOSIS — Z9189 Other specified personal risk factors, not elsewhere classified: Secondary | ICD-10-CM

## 2024-06-07 DIAGNOSIS — R519 Headache, unspecified: Secondary | ICD-10-CM

## 2024-06-07 DIAGNOSIS — R351 Nocturia: Secondary | ICD-10-CM

## 2024-06-07 DIAGNOSIS — G4750 Parasomnia, unspecified: Secondary | ICD-10-CM

## 2024-06-07 DIAGNOSIS — R0689 Other abnormalities of breathing: Secondary | ICD-10-CM

## 2024-06-07 DIAGNOSIS — G4752 REM sleep behavior disorder: Secondary | ICD-10-CM

## 2024-06-07 DIAGNOSIS — E66811 Obesity, class 1: Secondary | ICD-10-CM

## 2024-06-07 DIAGNOSIS — G4733 Obstructive sleep apnea (adult) (pediatric): Secondary | ICD-10-CM

## 2024-06-08 NOTE — Progress Notes (Unsigned)
 See procedure note.

## 2024-06-09 ENCOUNTER — Ambulatory Visit: Payer: Self-pay | Admitting: Neurology

## 2024-06-09 DIAGNOSIS — R519 Headache, unspecified: Secondary | ICD-10-CM

## 2024-06-09 DIAGNOSIS — G4733 Obstructive sleep apnea (adult) (pediatric): Secondary | ICD-10-CM

## 2024-06-09 NOTE — Procedures (Signed)
 "    [facility]  HOME SLEEP TEST (Watch PAT) REPORT  STUDY DATE: 06/07/24  DOB: March 06, 1981  MRN: [id number]  ORDERING CLINICIAN: True Mar, MD, PhD   REFERRING CLINICIAN:   CLINICAL INFORMATION/HISTORY (obtained from visit note dated 05/10/24): 44 year old female with an underlying medical history of anxiety, depression, B12 deficiency, vitamin D deficiency, iron deficiency, reflux disease, headaches, hiatal hernia, interstitial cystitis, anemia, irritable bowel syndrome, constipation, and obesity, who presents for evaluation of a new problem visit of recurrent headaches.    BMI: 32.4 kg/m  FINDINGS:   Sleep Summary:   Total Recording Time (hours, min): 7 hours, 42 min  Total Sleep Time (hours, min):  6 hours, 40 min  Percent REM (%):    15.1%   Respiratory Indices:   Calculated pAHI (per hour):  6.7/hour         REM pAHI:    11.8/hour       NREM pAHI: 5.8/hour  Central pAHI: 0/hour  Oxygen Saturation Statistics:    Oxygen Saturation (%) Mean: 95%   Minimum oxygen saturation (%):                 89%   O2 Saturation Range (%): 89-99%    O2 Saturation (minutes) <=88%: 0 min  Pulse Rate Statistics:   Pulse Mean (bpm):    73/min    Pulse Range (58-97/min)   IMPRESSION: OSA (obstructive sleep apnea), mild  RECOMMENDATION:  This home sleep test demonstrates overall mild obstructive sleep apnea with a total AHI of 6.7/hour and O2 nadir of 89%.  Variable snoring was detected, mostly in the mild to moderate range, at times louder.  Given the patient's medical history and sleep related complaints, therapy with a positive airway pressure device is a reasonable first-line choice and clinically recommended. Treatment can be achieved in the form of autoPAP trial/titration at home for now. A full night, in-lab PAP titration study may aid in improving proper treatment settings and with mask fit, if needed, down the road.  Alternative treatments may  include weight loss (where appropriate) along with avoidance of the supine sleep position (if possible), or an oral appliance in appropriate candidates.   Please note that untreated obstructive sleep apnea may carry additional perioperative morbidity. Patients with significant obstructive sleep apnea should receive perioperative PAP therapy and the surgeons and particularly the anesthesiologist should be informed of the diagnosis and the severity of the sleep disordered breathing. The patient should be cautioned not to drive, work at heights, or operate dangerous or heavy equipment when tired or sleepy. Review and reiteration of good sleep hygiene measures should be pursued with any patient. Other causes of the patient's symptoms, including circadian rhythm disturbances, an underlying mood disorder, medication effect and/or an underlying medical problem cannot be ruled out based on this test. Clinical correlation is recommended.  The patient and her referring provider will be notified of the test results. The patient will be seen in follow up in sleep clinic at [facility], as necessary.  I certify that I have reviewed the raw data recording prior to the issuance of this report in accordance with the standards of the American Academy of Sleep Medicine (AASM).    INTERPRETING PHYSICIAN:   True Mar, MD, PhD Medical Director, [facility]) Diplomat, ABPN (Neurology and Sleep)   [facility] [address] [place], [place] [address] [phone]                  "

## 2024-06-14 ENCOUNTER — Encounter: Payer: Self-pay | Admitting: Neurology

## 2024-06-15 NOTE — Telephone Encounter (Signed)
 Spoke to patient gave sleep study results Pt chose [facility] as DME Gave Pt [employer] # Pt aware of insurance compliance scheduled pt initial f/u visit Megan,NP 08/2024 sent orders to [facility] and forward sleep study results to referring MD this am  Pt expressed understanding and thanked me for calling

## 2024-06-15 NOTE — Telephone Encounter (Signed)
 Please review result note.

## 2024-06-16 ENCOUNTER — Encounter: Payer: Self-pay | Admitting: Adult Health

## 2024-06-16 ENCOUNTER — Encounter: Payer: Self-pay | Admitting: Neurology

## 2024-09-07 ENCOUNTER — Encounter: Admitting: Adult Health
